# Patient Record
Sex: Female | Born: 1970 | Race: Black or African American | Hispanic: No | Marital: Married | Smoking: Never smoker
Health system: Southern US, Community
[De-identification: ages and names within clinical notes are randomized; demographics above are authoritative.]

## PROBLEM LIST (undated history)

## (undated) DIAGNOSIS — R87629 Unspecified abnormal cytological findings in specimens from vagina: Secondary | ICD-10-CM

## (undated) DIAGNOSIS — D509 Iron deficiency anemia, unspecified: Secondary | ICD-10-CM

## (undated) DIAGNOSIS — D219 Benign neoplasm of connective and other soft tissue, unspecified: Secondary | ICD-10-CM

## (undated) HISTORY — PX: LIPOSUCTION: SHX10

## (undated) HISTORY — DX: Benign neoplasm of connective and other soft tissue, unspecified: D21.9

## (undated) HISTORY — DX: Unspecified abnormal cytological findings in specimens from vagina: R87.629

## (undated) HISTORY — PX: CRYOTHERAPY: SHX1416

---

## 2018-02-25 ENCOUNTER — Encounter: Payer: Self-pay | Admitting: Obstetrics & Gynecology

## 2018-02-25 ENCOUNTER — Ambulatory Visit (INDEPENDENT_AMBULATORY_CARE_PROVIDER_SITE_OTHER): Payer: BLUE CROSS/BLUE SHIELD | Admitting: Obstetrics & Gynecology

## 2018-02-25 ENCOUNTER — Other Ambulatory Visit (HOSPITAL_COMMUNITY)
Admission: RE | Admit: 2018-02-25 | Discharge: 2018-02-25 | Disposition: A | Discharge status: Home / Self Care | Payer: BLUE CROSS/BLUE SHIELD | Source: Ambulatory Visit | Attending: Obstetrics & Gynecology | Admitting: Obstetrics & Gynecology

## 2018-02-25 VITALS — BP 124/84 | HR 76 | Ht 62.0 in | Wt 125.1 lb

## 2018-02-25 DIAGNOSIS — N939 Abnormal uterine and vaginal bleeding, unspecified: Secondary | ICD-10-CM | POA: Diagnosis present

## 2018-02-25 DIAGNOSIS — Z1239 Encounter for other screening for malignant neoplasm of breast: Secondary | ICD-10-CM

## 2018-02-25 DIAGNOSIS — Z3202 Encounter for pregnancy test, result negative: Secondary | ICD-10-CM

## 2018-02-25 LAB — POCT URINE PREGNANCY: Preg Test, Ur: NEGATIVE

## 2018-02-25 MED ORDER — MEGESTROL ACETATE 40 MG PO TABS: 40.0000 mg | ORAL_TABLET | Freq: Two times a day (BID) | ORAL | 3 refills | Status: DC

## 2018-02-25 NOTE — Patient Instructions (Addendum)
Endometrial Biopsy, Care After This sheet gives you information about how to care for yourself after your procedure. Your health care provider may also give you more specific instructions. If you have problems or questions, contact your health care provider. What can I expect after the procedure? After the procedure, it is common to have:  Mild cramping.  A small amount of vaginal bleeding for a few days. This is normal.  Follow these instructions at home:  Take over-the-counter and prescription medicines only as told by your health care provider.  Do not douche, use tampons, or have sexual intercourse until your health care provider approves.  Return to your normal activities as told by your health care provider. Ask your health care provider what activities are safe for you.  Follow instructions from your health care provider about any activity restrictions, such as restrictions on strenuous exercise or heavy lifting. Contact a health care provider if:  You have heavy bleeding, or bleed for longer than 2 days after the procedure.  You have bad smelling discharge from your vagina.  You have a fever or chills.  You have a burning sensation when urinating or you have difficulty urinating.  You have severe pain in your lower abdomen. Get help right away if:  You have severe cramps in your stomach or back.  You pass large blood clots.  Your bleeding increases.  You become weak or light-headed, or you pass out. Summary  After the procedure, it is common to have mild cramping and a small amount of vaginal bleeding for a few days.  Do not douche, use tampons, or have sexual intercourse until your health care provider approves.  Return to your normal activities as told by your health care provider. Ask your health care provider what activities are safe for you. This information is not intended to replace advice given to you by your health care provider. Make sure you discuss any  questions you have with your health care provider. Document Released: 03/03/2013 Document Revised: 05/29/2016 Document Reviewed: 05/29/2016 Elsevier Interactive Patient Education  2017 [employer] Dysfunctional Uterine Bleeding Dysfunctional uterine bleeding is abnormal bleeding from the uterus. Dysfunctional uterine bleeding includes:  A period that comes earlier or later than usual.  A period that is lighter, heavier, or has blood clots.  Bleeding between periods.  Skipping one or more periods.  Bleeding after sexual intercourse.  Bleeding after menopause.  Follow these instructions at home: Pay attention to any changes in your symptoms. Follow these instructions to help with your condition: Eating and drinking  Eat well-balanced meals. Include foods that are high in iron, such as liver, meat, shellfish, green leafy vegetables, and eggs.  If you become constipated: ? Drink plenty of water. ? Eat fruits and vegetables that are high in water and fiber, such as spinach, carrots, raspberries, apples, and mango. Medicines  Take over-the-counter and prescription medicines only as told by your health care provider.  Do not change medicines without talking with your health care provider.  Aspirin or medicines that contain aspirin may make the bleeding worse. Do not take those medicines: ? During the week before your period. ? During your period.  If you were prescribed iron pills, take them as told by your health care provider. Iron pills help to replace iron that your body loses because of this condition. Activity  If you need to change your sanitary pad or tampon more than one time every 2 hours: ? Lie in bed with your feet  raised (elevated). ? Place a cold pack on your lower abdomen. ? Rest as much as possible until the bleeding stops or slows down.  Do not try to lose weight until the bleeding has stopped and your blood iron level is back to normal. Other  Instructions  For two months, write down: ? When your period starts. ? When your period ends. ? When any abnormal bleeding occurs. ? What problems you notice.  Keep all follow up visits as told by your health care provider. This is important. Contact a health care provider if:  You get light-headed or weak.  You have nausea and vomiting.  You cannot eat or drink without vomiting.  You feel dizzy or have diarrhea while you are taking medicines.  You are taking birth control pills or hormones, and you want to change them or stop taking them. Get help right away if:  You develop a fever or chills.  You need to change your sanitary pad or tampon more than one time per hour.  Your bleeding becomes heavier, or your flow contains clots more often.  You develop pain in your abdomen.  You lose consciousness.  You develop a rash. This information is not intended to replace advice given to you by your health care provider. Make sure you discuss any questions you have with your health care provider. Document Released: 05/10/2000 Document Revised: 10/19/2015 Document Reviewed: 08/08/2014 Elsevier Interactive Patient Education  [employer].

## 2018-02-25 NOTE — Progress Notes (Signed)
Last pap on 09/04/2017 was normal.

## 2018-02-25 NOTE — Progress Notes (Signed)
Subjective:     Michele Harmon is a 47 y.o. female here for a routine exam. [id number] LNMP 05/2017 Current complaints: increased bleeding and cramps. cycles were prev 28 days and in 08/2017 the menses because heavy with clots. Pt is now wearing a tampon and an overnight pads.  The tampon has become uncomfortable. It would come out.   She was starting to wear the ultra super tampons but, even those were too uncomfortable due to how it sat in the pelvis. Pt now having menses irreg.  Pt b;eedfgin today very light Last spotting was Sept. The first day is light and by the 3rd day its heavy.  Pt will have 10-14 days between cycles. Pt not sexually active for 2 years.   Pt does recall begin dx'd with fibroids years prev but, she was told they were very small. Pt has started to feel a mss in her lower abd and does reports occ cramping in her plower abd.     Gynecologic History Patient's last menstrual period was 02/25/2018. Contraception: abstinence Last Pap: 08/2017. Results were: normal. Prev h/o abnormal pap: cells frozen many years prev >10  Last mammogram: 2017 Results were: normal  Obstetric History OB History  Gravida Para Term Preterm AB Living  2 2   2   2   SAB TAB Ectopic Multiple Live Births          2    # Outcome Date GA Lbr Len/2nd Weight Sex Delivery Anes PTL Lv  2 Preterm 2005 [redacted]w[redacted]d   M Vag-Spont EPI  LIV  1 Preterm 2001 [redacted]w[redacted]d   M Vag-Spont EPI  LIV     The following portions of the patient's history were reviewed and updated as appropriate: allergies, current medications, past family history, past medical history, past social history, past surgical history and problem list.  Review of Systems Pertinent items are noted in HPI.    Objective:  BP 124/84   Pulse 76   Ht 5\' 2"  (1.575 m)   Wt 125 lb 1.9 oz (56.8 kg)   LMP 02/25/2018   BMI 22.88 kg/m    CONSTITUTIONAL: Well-developed, well-nourished female in no acute distress.  HENT:  Normocephalic, atraumatic EYES: Conjunctivae  and EOM are normal. No scleral icterus.  NECK: Normal range of motion SKIN: Skin is warm and dry. No rash noted. Not diaphoretic.No pallor. [place]: Alert and oriented to person, place, and time. Normal coordination.  Lungs: CTA CV: RRR Abd; soft, NT, ND. Pelvic mass just above the symphysis pubis GU: EGBUS: no lesions Vagina: no blood in vault Cervix: no lesion; no mucopurulent d/c Uterus: enlarged mobile 12-14 weeks side. Extends to the right side. The mass is felt low on the left side. There is no tenderness. The uterus is mobile.  Adnexa: no masses; non tender   GYN procedure.  The indications for endometrial biopsy were reviewed.   Risks of the biopsy including cramping, bleeding, infection, uterine perforation, inadequate specimen and need for additional procedures  were discussed. The patient states she understands and agrees to undergo procedure today. Consent was signed. Time out was performed. Urine HCG was negative. A sterile speculum was placed in the patient's vagina and the cervix was prepped with Betadine. A single-toothed tenaculum was placed on the anterior lip of the cervix to stabilize it. The 3 mm pipelle was introduced into the endometrial cavity without difficulty to a depth of 10cm, and a moderate amount of tissue was obtained and sent to pathology. The instruments were  removed from the patient's vagina. Minimal bleeding from the cervix was noted. The patient tolerated the procedure well. Routine post-procedure instructions were given to the patient. The patient will follow up to review the results and for further management.     Assessment/ Plan:    1. Breast cancer screening  - MM DIGITAL SCREENING BILATERAL; Future  2. Abnormal uterine bleeding (AUB)- suspect due to uterine fibroids.   - US PELVIS (TRANSABDOMINAL ONLY); Future - US PELVIS TRANSVANGINAL NON-OB (TV ONLY); Future - POCT urine pregnancy - CBC - TSH Megace 40mg  bid.  3. Enlarged uterus-  fibroids.   Geniece Akers L. Harraway-Smith, M.D., [facility]lynn June

## 2018-02-26 LAB — CBC
HEMOGLOBIN: 12 g/dL (ref 11.1–15.9)
Hematocrit: 38.4 % (ref 34.0–46.6)
MCH: 24.6 pg — ABNORMAL LOW (ref 26.6–33.0)
MCHC: 31.3 g/dL — AB (ref 31.5–35.7)
MCV: 79 fL (ref 79–97)
PLATELETS: 313 10*3/uL (ref 150–450)
RBC: 4.88 x10E6/uL (ref 3.77–5.28)
RDW: 17.8 % — AB (ref 12.3–15.4)
WBC: 5.1 10*3/uL (ref 3.4–10.8)

## 2018-02-26 LAB — TSH: TSH: 2.1 u[IU]/mL (ref 0.450–4.500)

## 2018-03-02 ENCOUNTER — Telehealth: Payer: Self-pay

## 2018-03-02 ENCOUNTER — Ambulatory Visit (HOSPITAL_BASED_OUTPATIENT_CLINIC_OR_DEPARTMENT_OTHER): Payer: BLUE CROSS/BLUE SHIELD

## 2018-03-02 ENCOUNTER — Encounter (HOSPITAL_BASED_OUTPATIENT_CLINIC_OR_DEPARTMENT_OTHER): Payer: Self-pay

## 2018-03-02 ENCOUNTER — Ambulatory Visit (HOSPITAL_BASED_OUTPATIENT_CLINIC_OR_DEPARTMENT_OTHER)
Admission: RE | Admit: 2018-03-02 | Discharge: 2018-03-02 | Disposition: A | Discharge status: Home / Self Care | Payer: BLUE CROSS/BLUE SHIELD | Source: Ambulatory Visit | Attending: Obstetrics & Gynecology | Admitting: Obstetrics & Gynecology

## 2018-03-02 DIAGNOSIS — Z1239 Encounter for other screening for malignant neoplasm of breast: Secondary | ICD-10-CM | POA: Insufficient documentation

## 2018-03-02 NOTE — Telephone Encounter (Signed)
-----   Message from Lavonia Drafts, MD sent at 02/28/2018 11:27 PM EDT ----- Please call pt. Her endometrial biopsy shows possible polyps but, no evidence of malignancy. Keep taking the Megace and follow up as scheduled.   Thx  clh-S

## 2018-03-02 NOTE — Telephone Encounter (Signed)
Patient called and made aware that her endometrial biopsy was negative for any malignancy. Patient made aware that it did show some polyps and Dr.Harraway Tamala Julian would like for her to continue the Megace and follow up with her as scheduled. Kathrene Alu RN

## 2018-06-05 ENCOUNTER — Telehealth: Payer: Self-pay

## 2018-06-05 DIAGNOSIS — N939 Abnormal uterine and vaginal bleeding, unspecified: Secondary | ICD-10-CM

## 2018-06-05 MED ORDER — MEGESTROL ACETATE 40 MG PO TABS: 40.0000 mg | ORAL_TABLET | Freq: Two times a day (BID) | ORAL | 1 refills | Status: DC

## 2018-06-05 NOTE — Telephone Encounter (Signed)
Patient calling for refill on Megace. Patient needs to have follow up appointment. Patient placed on schedule and given refill to get her until follow up appointment. Kathrene Alu RN

## 2018-07-17 ENCOUNTER — Ambulatory Visit: Payer: BLUE CROSS/BLUE SHIELD | Admitting: Obstetrics & Gynecology

## 2018-07-22 ENCOUNTER — Encounter: Payer: Self-pay | Admitting: Obstetrics & Gynecology

## 2018-07-22 ENCOUNTER — Ambulatory Visit (HOSPITAL_BASED_OUTPATIENT_CLINIC_OR_DEPARTMENT_OTHER)
Admission: RE | Admit: 2018-07-22 | Discharge: 2018-07-22 | Disposition: A | Discharge status: Home / Self Care | Payer: BLUE CROSS/BLUE SHIELD | Source: Ambulatory Visit | Attending: Obstetrics & Gynecology | Admitting: Obstetrics & Gynecology

## 2018-07-22 ENCOUNTER — Ambulatory Visit (INDEPENDENT_AMBULATORY_CARE_PROVIDER_SITE_OTHER): Payer: BLUE CROSS/BLUE SHIELD | Admitting: Obstetrics & Gynecology

## 2018-07-22 VITALS — BP 137/89 | HR 82 | Resp 16 | Ht 62.0 in | Wt 124.1 lb

## 2018-07-22 DIAGNOSIS — D219 Benign neoplasm of connective and other soft tissue, unspecified: Secondary | ICD-10-CM | POA: Diagnosis not present

## 2018-07-22 DIAGNOSIS — N939 Abnormal uterine and vaginal bleeding, unspecified: Secondary | ICD-10-CM | POA: Diagnosis not present

## 2018-07-22 MED ORDER — MEGESTROL ACETATE 40 MG PO TABS: 40.0000 mg | ORAL_TABLET | Freq: Two times a day (BID) | ORAL | 6 refills | Status: DC

## 2018-07-22 MED ORDER — MEGESTROL ACETATE 40 MG PO TABS: 40.0000 mg | ORAL_TABLET | Freq: Two times a day (BID) | ORAL | 1 refills | Status: DC

## 2018-07-22 NOTE — Progress Notes (Signed)
History:  48 y.o. G2P0202 here today for f/u of AUB. S/p SVD x2 Pt reports that the bleeding is improved HOWEVER she wears a pad daily. It is much less but, its still spotting. Pt denies clots etc. Pt denies dizziness and SOB.  Pt reports that the bulge in her abd is very uncomfortable and is causing her pressure sx and urinary freq.     The following portions of the patient's history were reviewed and updated as appropriate: allergies, current medications, past family history, past medical history, past social history, past surgical history and problem list.  Review of Systems:  Pertinent items are noted in HPI.    Objective:  Physical Exam BP 137/89 (BP Location: Left Arm, Patient Position: Sitting, Cuff Size: Normal)   Pulse 82   Resp 16   Ht 5\' 2"  (1.575 m)   Wt 124 lb 1.3 oz (56.3 kg)   LMP 07/15/2018 (Approximate)   BMI 22.69 kg/m   CONSTITUTIONAL: Well-developed, well-nourished female in no acute distress.  HENT:  Normocephalic, atraumatic EYES: Conjunctivae and EOM are normal. No scleral icterus.  NECK: Normal range of motion SKIN: Skin is warm and dry. No rash noted. Not diaphoretic.No pallor. [place]: Alert and oriented to person, place, and time. Normal coordination.  Pelvic: deferred  Labs and Imaging 02/25/2018 Diagnosis Endometrium, biopsy - SECRETORY ENDOMETRIUM AND BENIGN ENDOMETRIAL POLYP. - NO HYPERPLASIA OR MALIGNANCY.  Assessment & Plan:  AUB  TV US today  Keep Megace 40mg  bid  Pt wants to f/u in 3 months to discuss hysterectomy. Written info given to  pt.  Will f/u sooner prn  Total face-to-face time with patient was 20 min.  Greater than 50% was spent in counseling and coordination of care with the patient.   Andalyn Heckstall L. Harraway-Smith, M.D., [facility] June

## 2018-07-22 NOTE — Patient Instructions (Signed)
Hysterectomy Information  A hysterectomy is a surgery in which the uterus is removed. The fallopian tubes and ovaries may be removed (bilateral salpingo-oophorectomy) as well. This procedure may be done to treat various medical problems. After the procedure, a woman will no longer have menstrual periods nor will she be able to become pregnant (sterile). What are the reasons for a hysterectomy? There are many reasons why a woman might have this procedure. They include:  Persistent, abnormal vaginal bleeding.  Long-term (chronic) pelvic pain or infection.  Endometriosis. This is when the lining of the uterus (endometrium) starts to grow outside the uterus.  Adenomyosis. This is when the endometrium starts to grow in the muscle of the uterus.  Pelvic organ prolapse. This is a condition in which the uterus falls down into the vagina.  Noncancerous growths in the uterus (uterine fibroids) that cause symptoms.  The presence of precancerous cells.  Cervical or uterine cancer. What are the different types of hysterectomy? There are three different types of hysterectomy:  Supracervical hysterectomy. In this type, the top part of the uterus is removed, but not the cervix.  Total hysterectomy. In this type, the uterus and cervix are removed.  Radical hysterectomy. In this type, the uterus, the cervix, and the tissue that holds the uterus in place (parametrium) are removed. What are the different ways a hysterectomy can be performed? There are many different ways a hysterectomy can be performed, including:  Abdominal hysterectomy. In this type, an incision is made in the abdomen. The uterus is removed through this incision.  Vaginal hysterectomy. In this type, an incision is made in the vagina. The uterus is removed through this incision. There are no abdominal incisions.  Conventional laparoscopic hysterectomy. In this type, three or four small incisions are made in the abdomen. A thin,  lighted tube with a camera (laparoscope) is inserted into one of the incisions. Other tools are put through the other incisions. The uterus is cut into small pieces. The small pieces are removed through the incisions or through the vagina.  Laparoscopically assisted vaginal hysterectomy (LAVH). In this type, three or four small incisions are made in the abdomen. Part of the surgery is performed laparoscopically and the other part is done vaginally. The uterus is removed through the vagina.  Robot-assisted laparoscopic hysterectomy. In this type, a laparoscope and other tools are inserted into three or four small incisions in the abdomen. A computer-controlled device is used to give the surgeon a 3D image and to help control the surgical instruments. This allows for more precise movements of surgical instruments. The uterus is cut into small pieces and removed through the incisions or removed through the vagina. Discuss the options with your health care provider to determine which type is the right one for you. What are the risks? Generally, this is a safe procedure. However, problems may occur, including:  Bleeding and risk of blood transfusion. Tell your health care provider if you do not want to receive any blood products.  Blood clots in the legs or lung.  Infection.  Damage to other structures or organs.  Allergic reactions to medicines.  Changing to an abdominal hysterectomy from one of the other techniques. What to expect after a hysterectomy  You will be given pain medicine.  You may need to stay in the hospital for 1- 2 days to recover, depending on the type of hysterectomy you had.  Follow your health care provider's instructions about exercise, driving, and general activities. Ask your   health care provider what activities are safe for you.  You will need to have someone with you for the first 3-5 days after you go home.  You will need to follow up with your surgeon in 2-4  weeks after surgery to evaluate your progress.  If the ovaries are removed, you will have early menopause symptoms such as hot flashes, night sweats, and insomnia.  If you had a hysterectomy for a problem that was not cancer or not a condition that could lead to cancer, then you no longer need Pap tests. However, even if you no longer need a Pap test, a regular pelvic exam is a good idea to make sure no other problems are developing. Questions to ask your health care provider  Is a hysterectomy medically necessary? Do I have other treatment options for my condition?  What are my options for hysterectomy procedure?  What organs and tissues need to be removed?  What are the risks?  What are the benefits?  How long will I need to stay in the hospital after the procedure?  How long will I need to recover at home?  What symptoms can I expect after the procedure? Summary  A hysterectomy is a surgery in which the uterus is removed. The fallopian tubes and ovaries may be removed (bilateral salpingo-oophorectomy) as well.  This procedure may be done to treat various medical problems. After the procedure, a woman will no longer have menstrual periods nor will she be able to become pregnant.  Discuss the options with your health care provider to determine which type of hysterectomy is the right one for you. This information is not intended to replace advice given to you by your health care provider. Make sure you discuss any questions you have with your health care provider. Document Released: 11/06/2000 Document Revised: 06/19/2016 Document Reviewed: 06/19/2016 Elsevier Interactive Patient Education  2019 [employer].

## 2018-10-08 ENCOUNTER — Ambulatory Visit: Payer: BLUE CROSS/BLUE SHIELD | Admitting: Obstetrics & Gynecology

## 2018-10-09 ENCOUNTER — Ambulatory Visit: Payer: BLUE CROSS/BLUE SHIELD | Admitting: Obstetrics & Gynecology

## 2018-11-23 ENCOUNTER — Other Ambulatory Visit: Payer: Self-pay

## 2018-11-23 ENCOUNTER — Encounter: Payer: Self-pay | Admitting: Obstetrics & Gynecology

## 2018-11-23 ENCOUNTER — Ambulatory Visit (INDEPENDENT_AMBULATORY_CARE_PROVIDER_SITE_OTHER): Payer: BC Managed Care – PPO | Admitting: Obstetrics & Gynecology

## 2018-11-23 DIAGNOSIS — N939 Abnormal uterine and vaginal bleeding, unspecified: Secondary | ICD-10-CM | POA: Diagnosis not present

## 2018-11-23 DIAGNOSIS — D219 Benign neoplasm of connective and other soft tissue, unspecified: Secondary | ICD-10-CM | POA: Diagnosis not present

## 2018-11-23 NOTE — Progress Notes (Signed)
Patient has had some bleeding everyday since Oct 2019. Kathrene Alu RN

## 2018-11-23 NOTE — Patient Instructions (Signed)
Hysterectomy Information  A hysterectomy is a surgery in which the uterus is removed. The fallopian tubes and ovaries may be removed (bilateral salpingo-oophorectomy) as well. This procedure may be done to treat various medical problems. After the procedure, a woman will no longer have menstrual periods nor will she be able to become pregnant (sterile). What are the reasons for a hysterectomy? There are many reasons why a woman might have this procedure. They include:  Persistent, abnormal vaginal bleeding.  Long-term (chronic) pelvic pain or infection.  Endometriosis. This is when the lining of the uterus (endometrium) starts to grow outside the uterus.  Adenomyosis. This is when the endometrium starts to grow in the muscle of the uterus.  Pelvic organ prolapse. This is a condition in which the uterus falls down into the vagina.  Noncancerous growths in the uterus (uterine fibroids) that cause symptoms.  The presence of precancerous cells.  Cervical or uterine cancer. What are the different types of hysterectomy? There are three different types of hysterectomy:  Supracervical hysterectomy. In this type, the top part of the uterus is removed, but not the cervix.  Total hysterectomy. In this type, the uterus and cervix are removed.  Radical hysterectomy. In this type, the uterus, the cervix, and the tissue that holds the uterus in place (parametrium) are removed. What are the different ways a hysterectomy can be performed? There are many different ways a hysterectomy can be performed, including:  Abdominal hysterectomy. In this type, an incision is made in the abdomen. The uterus is removed through this incision.  Vaginal hysterectomy. In this type, an incision is made in the vagina. The uterus is removed through this incision. There are no abdominal incisions.  Conventional laparoscopic hysterectomy. In this type, three or four small incisions are made in the abdomen. A thin,  lighted tube with a camera (laparoscope) is inserted into one of the incisions. Other tools are put through the other incisions. The uterus is cut into small pieces. The small pieces are removed through the incisions or through the vagina.  Laparoscopically assisted vaginal hysterectomy (LAVH). In this type, three or four small incisions are made in the abdomen. Part of the surgery is performed laparoscopically and the other part is done vaginally. The uterus is removed through the vagina.  Robot-assisted laparoscopic hysterectomy. In this type, a laparoscope and other tools are inserted into three or four small incisions in the abdomen. A computer-controlled device is used to give the surgeon a 3D image and to help control the surgical instruments. This allows for more precise movements of surgical instruments. The uterus is cut into small pieces and removed through the incisions or removed through the vagina. Discuss the options with your health care provider to determine which type is the right one for you. What are the risks? Generally, this is a safe procedure. However, problems may occur, including:  Bleeding and risk of blood transfusion. Tell your health care provider if you do not want to receive any blood products.  Blood clots in the legs or lung.  Infection.  Damage to other structures or organs.  Allergic reactions to medicines.  Changing to an abdominal hysterectomy from one of the other techniques. What to expect after a hysterectomy  You will be given pain medicine.  You may need to stay in the hospital for 1- 2 days to recover, depending on the type of hysterectomy you had.  Follow your health care provider's instructions about exercise, driving, and general activities. Ask your   health care provider what activities are safe for you.  You will need to have someone with you for the first 3-5 days after you go home.  You will need to follow up with your surgeon in 2-4  weeks after surgery to evaluate your progress.  If the ovaries are removed, you will have early menopause symptoms such as hot flashes, night sweats, and insomnia.  If you had a hysterectomy for a problem that was not cancer or not a condition that could lead to cancer, then you no longer need Pap tests. However, even if you no longer need a Pap test, a regular pelvic exam is a good idea to make sure no other problems are developing. Questions to ask your health care provider  Is a hysterectomy medically necessary? Do I have other treatment options for my condition?  What are my options for hysterectomy procedure?  What organs and tissues need to be removed?  What are the risks?  What are the benefits?  How long will I need to stay in the hospital after the procedure?  How long will I need to recover at home?  What symptoms can I expect after the procedure? Summary  A hysterectomy is a surgery in which the uterus is removed. The fallopian tubes and ovaries may be removed (bilateral salpingo-oophorectomy) as well.  This procedure may be done to treat various medical problems. After the procedure, a woman will no longer have menstrual periods nor will she be able to become pregnant.  Discuss the options with your health care provider to determine which type of hysterectomy is the right one for you. This information is not intended to replace advice given to you by your health care provider. Make sure you discuss any questions you have with your health care provider. Document Released: 11/06/2000 Document Revised: 04/25/2017 Document Reviewed: 06/19/2016 Elsevier Patient Education  2020 [employer].   Post-operative Instructions after Hysterectomy  Activity  During the first few days at home, you will need to listen to your body.  It is important to be up and move around.  It is equally important to rest when you feel tired.  You should not, however, just lie in bed for days.   This will make you stiff and does increase your risk of clot formation right after your surgery.  Try to increase activity level slowly each day.  You may climb stairs. Go slowly at first.  No heavy lifting (>pounds) for at least four weeks.  Heavy household chores like vacuuming, mopping, pushing furniture, and activities that require repetitive reaching should be avoided.  You can cook if it doesn't make you uncomfortable.  You can do laundry, just be careful when taking items in and out of the washer/dryer.  Do not lift a heavy laundry basket.  You can go grocery shopping if it doesn't make you too tired.  In general, you can bend and you can lift light objects but you must listen to your body.  If it hurts, don't do it.  You can drive in 9-92 days after surgery.  This is not a rule but a guideline. If you feel you can can comfortably sit behind the wheel, turn your head, and hit the breakes hard, then you can drive.  If you are taking narcotic pain medication during the day, then you SHOULD NOT DRIVE.  Nothing in the vagina for six weeks, including tampons, douching, or engaging in sexual activity.  You can shower the day you  get home from the hospital. No tub baths for one week.  Pain Management  You will have a prescription for medication containing narcotics, either Vicodin 5/500 or Percocet 5/325. You can take 2 tablets every 4-6 hours as needed for pain.  Only take this when you are hurting, not just every four hours because the directions say you can.  You should find that each day the pain is improved and that you can take less and/or spread out the time between doses.  You will also be given a prescription for Motrin 800 mg or advised to use over the counter Motrin 200 mg.  Generic Ibuprofen is the same as Motrin.  You can take 800 mg (or 4-200 mg tablets) every 8 with hours. Alternating this with your narcotic pain medication will help you use less pain medication.  You should not  exceed taking 2000 mg of Tylenol. This equals either four Vicodin or six Percocet.  You may find that for a day or two you require more medication than stated but this should not continue for several days.  If it does, please call the office so your medication can be changed.  Most narcotic pain medications require a hand written prescription.  If we need to change your medication, enough time is needed for a family member or friend to get to the office to pick up the prescription.  You can use a heating pad. This is especially good for pelvic cramping.  Incision Care  Your incision will either have Amer small paper strips on the (Steri-strips) or have skin super glue (Dermabond).  Do not pick these off.  They will get loose over the first two weeks after surgery.  When they are about to fall off, you can then remove them.  Use soap and water only to clean the incisions. An antibacterial soap like Dial is good to use.   Once the strips or glue comes off , you can use Neosporin, if you like.  After about three weeks, if you want to use something to help the scars go away faster; you may use Mederma or Vitamin E.  Use a small amount twice daily on the scars and rub it in good.  If you have any redness or draining from the incision that makes you think there is infection, pleas call the office.  Urination and Bowel Movements  A catheter is used to drain your bladder during the surgery.   You may have some stinging the first few days after surgery when you urinate.  This is normal.  It should go away quickly.  Some will experience bladder spasms for several days after surgery. This feels like a pain at the end of emptying your bladder.  This is not worrisome.  If it is getting worse or more intense, there are some medications tat can be used short term to help.  You will need to call the office.   Most women will have a bowel movement 2-4 days after surgery.  It takes this long because you will  probably d a bowel prep before surgery, because your food intake is often decreased and because narcotics pain medications can cause constipation.  To help prevent constipation, drink lots of fluids and stay hydrated.  You should use a mild stool softener like over the counter Colace (Docusate Sodium) twice daily.  Also, drinking warm liquids like coffee, tea, and hot chocolate can help.  Moving around helps too.  Stop the stool softener when  you are having regular bowel movements.  If the constipation is severe, you can progress to using Miralax (an over the counter product that you mix with a small amount of water) or use an enema.  DO NOT USE ANY LAXATIVES.  If you have questions, call the office.  Vaginal Care  You do not need to do anything special.  Just shower normally .  Expect spotting or light bleeding. Lonia Blood is different for everyone and can be as Villavicencio as spotting for a couple of days after surgery or spotting/light bleeding for weeks.  Do not use tampons.  You will need to wear a mini pad.  Important Reasons to Call the Office  Fever > 100.5. This is a real post-operative temperature and you will need to be seen.  Heavy vaginal bleeding like a period.  Unusual vaginal discharge or odor.  Redness or drainage from your incisions that look like infection.   Total Laparoscopic Hysterectomy A total laparoscopic hysterectomy is a minimally invasive surgery to remove the uterus and cervix. The fallopian tubes and ovaries can also be removed (bilateral salpingo-oophorectomy) during this surgery, if necessary. This procedure may be done to treat problems such as:  Noncancerous growths in the uterus (uterine fibroids) that cause symptoms.  A condition that causes the lining of the uterus (endometrium) to grow in other areas (endometriosis).  Problems with pelvic support. This is caused by weakened muscles of the pelvis following vaginal childbirth or menopause.  Cancer of the  cervix, ovaries, uterus, or endometrium.  Excessive (dysfunctional) uterine bleeding. This surgery is performed by inserting a thin, lighted tube (laparoscope) and surgical instruments into small incisions in the abdomen. The laparoscope sends images to a monitor. The images help the health care provider perform the procedure. After this procedure, you will no longer be able to have a baby, and you will no longer have a menstrual period. Tell a health care provider about:  Any allergies you have.  All medicines you are taking, including vitamins, herbs, eye drops, creams, and over-the-counter medicines.  Any problems you or family members have had with anesthetic medicines.  Any blood disorders you have.  Any surgeries you have had.  Any medical conditions you have.  Whether you are pregnant or may be pregnant. What are the risks? Generally, this is a safe procedure. However, problems may occur, including:  Infection.  Bleeding.  Blood clots in the legs or lungs.  Allergic reactions to medicines.  Damage to other structures or organs.  The risk that the surgery may have to be switched to the regular one in which a large incision is made in the abdomen (abdominal hysterectomy). What happens before the procedure? Staying hydrated Follow instructions from your health care provider about hydration, which may include:  Up to 2 hours before the procedure - you may continue to drink clear liquids, such as water, clear fruit juice, black coffee, and plain tea Eating and drinking restrictions Follow instructions from your health care provider about eating and drinking, which may include:  8 hours before the procedure - stop eating heavy meals or foods such as meat, fried foods, or fatty foods.  6 hours before the procedure - stop eating light meals or foods, such as toast or cereal.  6 hours before the procedure - stop drinking milk or drinks that contain milk.  2 hours before  the procedure - stop drinking clear liquids. Medicines  Ask your health care provider about: ? Changing or stopping your  regular medicines. This is especially important if you are taking diabetes medicines or blood thinners. ? Taking over-the-counter medicines, vitamins, herbs, and supplements. ? Taking medicines such as aspirin and ibuprofen. These medicines can thin your blood. Do not take these medicines unless your health care provider tells you to take them.  You may be given antibiotic medicine to help prevent infection.  You may be asked to take laxatives.  You may be given medicines to help prevent nausea and vomiting after the procedure. General instructions  Ask your health care provider how your surgical site will be marked or identified.  You may be asked to shower with a germ-killing soap.  Do not use any products that contain nicotine or tobacco, such as cigarettes and e-cigarettes. If you need help quitting, ask your health care provider.  You may have an exam or testing, such as an ultrasound to determine the size and shape of your pelvic organs.  You may have a blood or urine sample taken.  This procedure can affect the way you feel about yourself. Talk with your health care provider about the physical and emotional changes hysterectomy may cause.  Plan to have someone take you home from the hospital or clinic.  Plan to have a responsible adult care for you for at least 24 hours after you leave the hospital or clinic. This is important. What happens during the procedure?  To lower your risk of infection: ? Your health care team will wash or sanitize their hands. ? Your skin will be washed with soap. ? Hair may be removed from the surgical area.  An IV will be inserted into one of your veins.  You will be given one or more of the following: ? A medicine to help you relax (sedative). ? A medicine to make you fall asleep (general anesthetic).  You will be  given antibiotic medicine through your IV.  A tube may be inserted down your throat to help you breathe during the procedure.  A gas (carbon dioxide) will be used to inflate your abdomen to allow your surgeon to see inside of your abdomen.  Three or four small incisions will be made in your abdomen.  A laparoscope will be inserted into one of your incisions. Surgical instruments will be inserted through the other incisions in order to perform the procedure.  Your uterus and cervix may be removed through your vagina or cut into small pieces and removed through the small incisions. Any other organs that need to be removed will also be removed this way.  Carbon dioxide will be released from inside of your abdomen.  Your incisions will be closed with stitches (sutures).  A bandage (dressing) may be placed over your incisions. The procedure may vary among health care providers and hospitals. What happens after the procedure?  Your blood pressure, heart rate, breathing rate, and blood oxygen level will be monitored until the medicines you were given have worn off.  You will be given medicine for pain and nausea as needed.  Do not drive for 24 hours if you received a sedative. Summary  Total Laparoscopic hysterectomy is a procedure to remove your uterus, cervix and sometimes the fallopian tubes and ovaries.  This procedure can affect the way you feel about yourself. Talk with your health care provider about the physical and emotional changes hysterectomy may cause.  After this procedure, you will no longer be able to have a baby, and you will no longer have a menstrual  period.  You will be given pain medicine to control discomfort after this procedure. This information is not intended to replace advice given to you by your health care provider. Make sure you discuss any questions you have with your health care provider. Document Released: 03/10/2007 Document Revised: 04/25/2017 Document  Reviewed: 07/24/2016 Elsevier Patient Education  2020 [employer].

## 2018-11-23 NOTE — Progress Notes (Signed)
History:  48 y.o. [id number] here today for f/u of AUB due to fibroids and discussion of management. Pt would like to proceed with a hysterectomy but, she wants to discuss the price before she decides on a location.  She is taking the Megace and reports improvement of the bleeding but, she does not want to continue the meds and she reports pain as well and wants definitive management. She works at a sedentary job.    The following portions of the patient's history were reviewed and updated as appropriate: allergies, current medications, past family history, past medical history, past social history, past surgical history and problem list.  Review of Systems:  Pertinent items are noted in HPI.    Objective:  Physical Exam Blood pressure 121/86, pulse 86, height 5\' 2"  (1.575 m), weight 125 lb (56.7 kg).  CONSTITUTIONAL: Well-developed, well-nourished female in no acute distress.  HENT:  Normocephalic, atraumatic EYES: Conjunctivae and EOM are normal. No scleral icterus.  NECK: Normal range of motion SKIN: Skin is warm and dry. No rash noted. Not diaphoretic.No pallor. [place]: Alert and oriented to person, place, and time. Normal coordination.  Abd: Soft, nontender and nondistended; mobile mass effect in lower abd  Pelvic: Normal appearing external genitalia; normal appearing vaginal mucosa and cervix.  Normal discharge.  Enalrged uterus, 12 weeks sized. Mobile. Wide base. No other palpable masses, no uterine or adnexal tenderness. Some descensus.     Assessment & Plan:  Symptomatic uterine fibroids. I reviewed hysterectomy options and answered questions in details about the different types including RATH, TLH, LAVH, TVH and TAH. I reveiwed the risks and benefits of each type based on her specific presentation and uterine size.   Patient desires surgical management with TLH with bilateral salpingectomy. . Pt may consider [place] with bilateral salpingectomy depending on cost. She would like to  speak with a financial counselor.   She will f/u prior to the surgery for preop counseling once she determines will which type of procedure to have.  She was told that she will be contacted by our surgical scheduler regarding the time and date of her surgery.Printed patient education handouts about the procedure were given to the patient to review at home.  Total face-to-face time with patient was 20 min.  Greater than 50% was spent in counseling and coordination of care with the patient.   Cambrie Sonnenfeld L. Harraway-Smith, M.D., [facility]lynn June

## 2018-12-10 ENCOUNTER — Ambulatory Visit (INDEPENDENT_AMBULATORY_CARE_PROVIDER_SITE_OTHER): Payer: BC Managed Care – PPO | Admitting: Obstetrics & Gynecology

## 2018-12-10 ENCOUNTER — Encounter: Payer: Self-pay | Admitting: Obstetrics & Gynecology

## 2018-12-10 VITALS — Ht 62.0 in

## 2018-12-10 DIAGNOSIS — N939 Abnormal uterine and vaginal bleeding, unspecified: Secondary | ICD-10-CM | POA: Diagnosis not present

## 2018-12-10 DIAGNOSIS — D219 Benign neoplasm of connective and other soft tissue, unspecified: Secondary | ICD-10-CM

## 2018-12-10 MED ORDER — INTEGRA F 125-1 MG PO CAPS: 1.0000 | ORAL_CAPSULE | Freq: Two times a day (BID) | ORAL | 6 refills | Status: DC

## 2018-12-10 NOTE — Progress Notes (Signed)
TELEHEALTH GYNECOLOGY VIRTUAL VIDEO VISIT ENCOUNTER NOTE  Provider location: [facility] [employer] at [facility]   I connected with [place] on 12/10/18 at  8:30 AM EDT by WebEx Video Encounter at home and verified that I am speaking with the correct person using two identifiers.   I discussed the limitations, risks, security and privacy concerns of performing an evaluation and management service virtually and the availability of in person appointments. I also discussed with the patient that there may be a patient responsible charge related to this service. The patient expressed understanding and agreed to proceed.   History:  Michele Harmon is a 48 y.o. [id number] LMP continued bleeding for 9 months. Being evaluated today for preop counseling prior to a [place] with bilateral salpingectomy.  She denies any new sx since her last visit.       Past Medical History:  Diagnosis Date   Fibroids    Vaginal Pap smear, abnormal    Past Surgical History:  Procedure Laterality Date   CRYOTHERAPY     cervical   LIPOSUCTION Bilateral    arms   The following portions of the patient's history were reviewed and updated as appropriate: allergies, current medications, past family history, past medical history, past social history, past surgical history and problem list.   Health Maintenance:  Normal pap and negative HRHPV on 08/2017.  Normal mammogram on 10//2019 .  Endo bx: 02/25/2018 Diagnosis Endometrium, biopsy - SECRETORY ENDOMETRIUM AND BENIGN ENDOMETRIAL POLYP. - NO HYPERPLASIA OR MALIGNANCY. Review of Systems:  Pertinent items noted in HPI and remainder of comprehensive ROS otherwise negative.  Physical Exam:   General:  Alert, oriented and cooperative. Patient appears to be in no acute distress.  Mental Status: Normal mood and affect. Normal behavior. Normal judgment and thought content.   Respiratory: Normal respiratory effort, no problems with respiration noted    Rest of physical exam deferred due to type of encounter  Labs and Imaging  02/25/2018  Diagnosis Endometrium, biopsy - SECRETORY ENDOMETRIUM AND BENIGN ENDOMETRIAL POLYP. - NO HYPERPLASIA OR MALIGNANCY.  CBC    Component Value Date/Time   WBC 5.1 02/25/2018 1530   RBC 4.88 02/25/2018 1530   HGB 12.0 02/25/2018 1530   HCT 38.4 02/25/2018 1530   PLT 313 02/25/2018 1530   MCV 79 02/25/2018 1530   MCH 24.6 (L) 02/25/2018 1530   MCHC 31.3 (L) 02/25/2018 1530   RDW 17.8 (H) 02/25/2018 1530    Assessment and Plan:     1. Fibroids 2. AUB  Patient desires surgical management with [facility] .  The risks of surgery were discussed in detail with the patient including but not limited to: bleeding which may require transfusion or reoperation; infection which may require prolonged hospitalization or re-hospitalization and antibiotic therapy; injury to bowel, bladder, ureters and major vessels or other surrounding organs; need for additional procedures including laparotomy; thromboembolic phenomenon, incisional problems and other postoperative or anesthesia complications.  Patient was told that the likelihood that her condition and symptoms will be treated effectively with this surgical management was very high; the postoperative expectations were also discussed in detail. The patient also understands the alternative treatment options which were discussed in full. All questions were answered.  She was told that she will be contacted by our surgical scheduler regarding the time and date of her surgery; routine preoperative instructions of having nothing to eat or drink after midnight on the day prior to surgery and also coming to the hospital  1 1/2 hours prior to her time of surgery were also emphasized.  She was told she may be called for a preoperative appointment about a week prior to surgery and will be given further preoperative instructions at that visit. Printed patient education handouts about the  procedure were given to the patient to review at home.       I discussed the assessment and treatment plan with the patient. The patient was provided an opportunity to ask questions and all were answered. The patient agreed with the plan and demonstrated an understanding of the instructions.   The patient was advised to call back or seek an in-person evaluation/go to the ED if the symptoms worsen or if the condition fails to improve as anticipated.  I provided 25 minutes of face-to-face time during this encounter.   Lavonia Drafts, MD [facility], [place]

## 2018-12-10 NOTE — Patient Instructions (Signed)
Total Laparoscopic Hysterectomy °A total laparoscopic hysterectomy is a minimally invasive surgery to remove the uterus and cervix. The fallopian tubes and ovaries can also be removed (bilateral salpingo-oophorectomy) during this surgery, if necessary. This procedure may be done to treat problems such as: °· Noncancerous growths in the uterus (uterine fibroids) that cause symptoms. °· A condition that causes the lining of the uterus (endometrium) to grow in other areas (endometriosis). °· Problems with pelvic support. This is caused by weakened muscles of the pelvis following vaginal childbirth or menopause. °· Cancer of the cervix, ovaries, uterus, or endometrium. °· Excessive (dysfunctional) uterine bleeding. °This surgery is performed by inserting a thin, lighted tube (laparoscope) and surgical instruments into small incisions in the abdomen. The laparoscope sends images to a monitor. The images help the health care provider perform the procedure. After this procedure, you will no longer be able to have a baby, and you will no longer have a menstrual period. °Tell a health care provider about: °· Any allergies you have. °· All medicines you are taking, including vitamins, herbs, eye drops, creams, and over-the-counter medicines. °· Any problems you or family members have had with anesthetic medicines. °· Any blood disorders you have. °· Any surgeries you have had. °· Any medical conditions you have. °· Whether you are pregnant or may be pregnant. °What are the risks? °Generally, this is a safe procedure. However, problems may occur, including: °· Infection. °· Bleeding. °· Blood clots in the legs or lungs. °· Allergic reactions to medicines. °· Damage to other structures or organs. °· The risk that the surgery may have to be switched to the regular one in which a large incision is made in the abdomen (abdominal hysterectomy). °What happens before the procedure? °Staying hydrated °Follow instructions from your  health care provider about hydration, which may include: °· Up to 2 hours before the procedure - you may continue to drink clear liquids, such as water, clear fruit juice, black coffee, and plain tea °Eating and drinking restrictions °Follow instructions from your health care provider about eating and drinking, which may include: °· 8 hours before the procedure - stop eating heavy meals or foods such as meat, fried foods, or fatty foods. °· 6 hours before the procedure - stop eating light meals or foods, such as toast or cereal. °· 6 hours before the procedure - stop drinking milk or drinks that contain milk. °· 2 hours before the procedure - stop drinking clear liquids. °Medicines °· Ask your health care provider about: °? Changing or stopping your regular medicines. This is especially important if you are taking diabetes medicines or blood thinners. °? Taking over-the-counter medicines, vitamins, herbs, and supplements. °? Taking medicines such as aspirin and ibuprofen. These medicines can thin your blood. Do not take these medicines unless your health care provider tells you to take them. °· You may be given antibiotic medicine to help prevent infection. °· You may be asked to take laxatives. °· You may be given medicines to help prevent nausea and vomiting after the procedure. °General instructions °· Ask your health care provider how your surgical site will be marked or identified. °· You may be asked to shower with a germ-killing soap. °· Do not use any products that contain nicotine or tobacco, such as cigarettes and e-cigarettes. If you need help quitting, ask your health care provider. °· You may have an exam or testing, such as an ultrasound to determine the size and shape of your pelvic organs. °·   You may have a blood or urine sample taken.  This procedure can affect the way you feel about yourself. Talk with your health care provider about the physical and emotional changes hysterectomy may  cause.  Plan to have someone take you home from the hospital or clinic.  Plan to have a responsible adult care for you for at least 24 hours after you leave the hospital or clinic. This is important. What happens during the procedure?  To lower your risk of infection: ? Your health care team will wash or sanitize their hands. ? Your skin will be washed with soap. ? Hair may be removed from the surgical area.  An IV will be inserted into one of your veins.  You will be given one or more of the following: ? A medicine to help you relax (sedative). ? A medicine to make you fall asleep (general anesthetic).  You will be given antibiotic medicine through your IV.  A tube may be inserted down your throat to help you breathe during the procedure.  A gas (carbon dioxide) will be used to inflate your abdomen to allow your surgeon to see inside of your abdomen.  Three or four small incisions will be made in your abdomen.  A laparoscope will be inserted into one of your incisions. Surgical instruments will be inserted through the other incisions in order to perform the procedure.  Your uterus and cervix may be removed through your vagina or cut into small pieces and removed through the small incisions. Any other organs that need to be removed will also be removed this way.  Carbon dioxide will be released from inside of your abdomen.  Your incisions will be closed with stitches (sutures).  A bandage (dressing) may be placed over your incisions. The procedure may vary among health care providers and hospitals. What happens after the procedure?  Your blood pressure, heart rate, breathing rate, and blood oxygen level will be monitored until the medicines you were given have worn off.  You will be given medicine for pain and nausea as needed.  Do not drive for 24 hours if you received a sedative. Summary  Total Laparoscopic hysterectomy is a procedure to remove your uterus, cervix and  sometimes the fallopian tubes and ovaries.  This procedure can affect the way you feel about yourself. Talk with your health care provider about the physical and emotional changes hysterectomy may cause.  After this procedure, you will no longer be able to have a baby, and you will no longer have a menstrual period.  You will be given pain medicine to control discomfort after this procedure. This information is not intended to replace advice given to you by your health care provider. Make sure you discuss any questions you have with your health care provider. Document Released: 03/10/2007 Document Revised: 04/25/2017 Document Reviewed: 07/24/2016 Elsevier Patient Education  2020 [place] [employer]. Total Laparoscopic Hysterectomy, Care After This sheet gives you information about how to care for yourself after your procedure. Your health care provider may also give you more specific instructions. If you have problems or questions, contact your health care provider. What can I expect after the procedure? After the procedure, it is common to have:  Pain and bruising around your incisions.  A sore throat, if a breathing tube was used during surgery.  Fatigue.  Poor appetite.  Less interest in sex. If your ovaries were also removed, it is also common to have symptoms of menopause such as hot  flashes, night sweats, and lack of sleep (insomnia). Follow these instructions at home: Bathing  Do not take baths, swim, or use a hot tub until your health care provider approves. You may need to only take showers for 2-3 weeks.  Keep your bandage (dressing) dry until your health care provider says it can be removed. Incision care   Follow instructions from your health care provider about how to take care of your incisions. Make sure you: ? Wash your hands with soap and water before you change your dressing. If soap and water are not available, use hand sanitizer. ? Change your dressing as told by  your health care provider. ? Leave stitches (sutures), skin glue, or adhesive strips in place. These skin closures may need to stay in place for 2 weeks or longer. If adhesive strip edges start to loosen and curl up, you may trim the loose edges. Do not remove adhesive strips completely unless your health care provider tells you to do that.  Check your incision area every day for signs of infection. Check for: ? Redness, swelling, or pain. ? Fluid or blood. ? Warmth. ? Pus or a bad smell. Activity  Get plenty of rest and sleep.  Do not lift anything that is heavier than 10 lbs (4.5 kg) for one month after surgery, or as long as told by your health care provider.  Do not drive or use heavy machinery while taking prescription pain medicine.  Do not drive for 24 hours if you were given a medicine to help you relax (sedative).  Return to your normal activities as told by your health care provider. Ask your health care provider what activities are safe for you. Lifestyle   Do not use any products that contain nicotine or tobacco, such as cigarettes and e-cigarettes. These can delay healing. If you need help quitting, ask your health care provider.  Do not drink alcohol until your health care provider approves. General instructions  Do not douche, use tampons, or have sex for at least 6 weeks, or as told by your health care provider.  Take over-the-counter and prescription medicines only as told by your health care provider.  To monitor yourself for a fever, take your temperature at least once a day during recovery.  If you struggle with physical or emotional changes after your procedure, speak with your health care provider or a therapist.  To prevent or treat constipation while you are taking prescription pain medicine, your health care provider may recommend that you: ? Drink enough fluid to keep your urine clear or pale yellow. ? Take over-the-counter or prescription  medicines. ? Eat foods that are high in fiber, such as fresh fruits and vegetables, whole grains, and beans. ? Limit foods that are high in fat and processed sugars, such as fried and sweet foods.  Keep all follow-up visits as told by your health care provider. This is important. Contact a health care provider if:  You have chills or a fever.  You have redness, swelling, or pain around an incision.  You have fluid or blood coming from an incision.  Your incision feels warm to the touch.  You have pus or a bad smell coming from an incision.  An incision breaks open.  You feel dizzy or light-headed.  You have pain or bleeding when you urinate.  You have diarrhea, nausea, or vomiting that does not go away.  You have abnormal vaginal discharge.  You have a rash.  You have  pain that does not get better with medicine. Get help right away if:  You have a fever and your symptoms suddenly get worse.  You have severe abdominal pain.  You have chest pain.  You have shortness of breath.  You faint.  You have pain, swelling, or redness on your leg.  You have heavy vaginal bleeding with blood clots. Summary  After the procedure it is common to have abdominal pain. Your provider will give you medication for this.  Do not take baths, swim, or use a hot tub until your health care provider approves.  Do not lift anything that is heavier than 10 lbs (4.5 kg) for one month after surgery, or as long as told by your health care provider.  Notify your provider if you have any signs or symptoms of infection after the procedure. This information is not intended to replace advice given to you by your health care provider. Make sure you discuss any questions you have with your health care provider. Document Released: 03/03/2013 Document Revised: 04/25/2017 Document Reviewed: 07/24/2016 Elsevier Patient Education  2020 [employer].

## 2018-12-13 ENCOUNTER — Encounter: Payer: Self-pay | Admitting: Obstetrics & Gynecology

## 2018-12-24 NOTE — Progress Notes (Signed)
Please clarify on surgical consent order/epic noted abrreviations. Thanks.

## 2018-12-25 ENCOUNTER — Other Ambulatory Visit (HOSPITAL_COMMUNITY)
Admission: RE | Admit: 2018-12-25 | Discharge: 2018-12-25 | Disposition: A | Discharge status: Home / Self Care | Payer: BC Managed Care – PPO | Source: Ambulatory Visit | Attending: Obstetrics & Gynecology | Admitting: Obstetrics & Gynecology

## 2018-12-25 DIAGNOSIS — Z01812 Encounter for preprocedural laboratory examination: Secondary | ICD-10-CM | POA: Diagnosis present

## 2018-12-25 DIAGNOSIS — Z20828 Contact with and (suspected) exposure to other viral communicable diseases: Secondary | ICD-10-CM | POA: Insufficient documentation

## 2018-12-25 LAB — SARS CORONAVIRUS 2 (TAT 6-24 HRS): SARS Coronavirus 2: NEGATIVE

## 2018-12-28 ENCOUNTER — Other Ambulatory Visit: Payer: Self-pay

## 2018-12-28 ENCOUNTER — Encounter (HOSPITAL_COMMUNITY)
Admission: RE | Admit: 2018-12-28 | Discharge: 2018-12-28 | Disposition: A | Discharge status: Home / Self Care | Payer: BC Managed Care – PPO | Source: Ambulatory Visit | Attending: Obstetrics & Gynecology | Admitting: Obstetrics & Gynecology

## 2018-12-28 ENCOUNTER — Encounter (HOSPITAL_COMMUNITY): Payer: Self-pay

## 2018-12-28 DIAGNOSIS — Z01812 Encounter for preprocedural laboratory examination: Secondary | ICD-10-CM | POA: Diagnosis present

## 2018-12-28 DIAGNOSIS — N939 Abnormal uterine and vaginal bleeding, unspecified: Secondary | ICD-10-CM | POA: Diagnosis not present

## 2018-12-28 DIAGNOSIS — D259 Leiomyoma of uterus, unspecified: Secondary | ICD-10-CM | POA: Diagnosis not present

## 2018-12-28 HISTORY — DX: Iron deficiency anemia, unspecified: D50.9

## 2018-12-28 LAB — CBC
HCT: 33.9 % — ABNORMAL LOW (ref 36.0–46.0)
Hemoglobin: 9.8 g/dL — ABNORMAL LOW (ref 12.0–15.0)
MCH: 20.4 pg — ABNORMAL LOW (ref 26.0–34.0)
MCHC: 28.9 g/dL — ABNORMAL LOW (ref 30.0–36.0)
MCV: 70.6 fL — ABNORMAL LOW (ref 80.0–100.0)
Platelets: 417 10*3/uL — ABNORMAL HIGH (ref 150–400)
RBC: 4.8 MIL/uL (ref 3.87–5.11)
RDW: 20.9 % — ABNORMAL HIGH (ref 11.5–15.5)
WBC: 7 10*3/uL (ref 4.0–10.5)
nRBC: 0 % (ref 0.0–0.2)

## 2018-12-28 LAB — BASIC METABOLIC PANEL
Anion gap: 6 (ref 5–15)
BUN: 9 mg/dL (ref 6–20)
CO2: 24 mmol/L (ref 22–32)
Calcium: 9.8 mg/dL (ref 8.9–10.3)
Chloride: 112 mmol/L — ABNORMAL HIGH (ref 98–111)
Creatinine, Ser: 0.73 mg/dL (ref 0.44–1.00)
GFR calc Af Amer: >60 mL/min (ref >60)
GFR calc non Af Amer: >60 mL/min (ref >60)
Glucose, Bld: 87 mg/dL (ref 70–99)
Potassium: 3.8 mmol/L (ref 3.5–5.1)
Sodium: 142 mmol/L (ref 135–145)

## 2018-12-28 LAB — ABO/RH: ABO/RH(D): B POS

## 2018-12-28 NOTE — Anesthesia Preprocedure Evaluation (Addendum)
Anesthesia Evaluation  Patient identified by MRN, date of birth, ID band Patient awake    Reviewed: Allergy & Precautions, NPO status , Patient's Chart, lab work & pertinent test results  Airway Mallampati: II  TM Distance: >3 FB Neck ROM: Full    Dental  (+) Dental Advisory Given   Pulmonary    breath sounds clear to auscultation       Cardiovascular negative cardio ROS   Rhythm:Regular Rate:Normal     Neuro/Psych negative neurological ROS     GI/Hepatic negative GI ROS, Neg liver ROS,   Endo/Other  negative endocrine ROS  Renal/GU negative Renal ROS     Musculoskeletal   Abdominal   Peds  Hematology  (+) anemia ,   Anesthesia Other Findings   Reproductive/Obstetrics                           Lab Results  Component Value Date   WBC 7.0 12/28/2018   HGB 9.8 (L) 12/28/2018   HCT 33.9 (L) 12/28/2018   MCV 70.6 (L) 12/28/2018   PLT 417 (H) 12/28/2018   No results found for: CREATININE, BUN, NA, K, CL, CO2 COVID-19 Labs  No results for input(s): DDIMER, FERRITIN, LDH, CRP in the last 72 hours.  Lab Results  Component Value Date   [place] NEGATIVE 12/25/2018    Anesthesia Physical Anesthesia Plan  ASA: II  Anesthesia Plan: General   Post-op Pain Management:    Induction: Intravenous  PONV Risk Score and Plan: 3 and Dexamethasone, Ondansetron and Treatment may vary due to age or medical condition  Airway Management Planned: Oral ETT  Additional Equipment:   Intra-op Plan:   Post-operative Plan: Extubation in OR  Informed Consent: I have reviewed the patients History and Physical, chart, labs and discussed the procedure including the risks, benefits and alternatives for the proposed anesthesia with the patient or authorized representative who has indicated his/her understanding and acceptance.     Dental advisory given  Plan Discussed with: CRNA  Anesthesia  Plan Comments:        Anesthesia Quick Evaluation

## 2018-12-28 NOTE — Patient Instructions (Signed)
DUE TO COVID-19 ONLY ONE VISITOR IS ALLOWED.  Your procedure is scheduled on: Tomorrow, Aug, 4, 2020  Report to [facility] AT  7:30 A. M.   Call this number if you have problems the morning of surgery:  [phone].   OUR ADDRESS IS [address].  WE ARE LOCATED IN THE [place]                                   [address] [address].                                     REMEMBER:  DO NOT EAT FOOD OR DRINK LIQUIDS AFTER MIDNIGHT .    BRUSH YOUR TEETH THE MORNING OF SURGERY.  TAKE THESE MEDICATIONS MORNING OF SURGERY WITH A SIP OF WATER:  None  DO NOT WEAR JEWERLY, MAKE UP, OR NAIL POLISH.  DO NOT WEAR LOTIONS, POWDERS, PERFUMES/COLOGNE OR DEODORANT.  DO NOT SHAVE FOR 24 HOURS PRIOR TO DAY OF SURGERY.  CONTACTS, GLASSES, OR DENTURES MAY NOT BE WORN TO SURGERY.                                    [facility] IS NOT RESPONSIBLE  FOR ANY BELONGINGS.                                                                    [facility] Before surgery, you can play an important role.  Because skin is not sterile, your skin needs to be as free of germs as possible.  You can reduce the number of germs on your skin by washing with CHG (chlorahexidine gluconate) soap before surgery.  CHG is an antiseptic cleaner which kills germs and bonds with the skin to continue killing germs even after washing. Please DO NOT use if you have an allergy to CHG or antibacterial soaps.  If your skin becomes reddened/irritated stop using the CHG and inform your nurse when you arrive at Short Stay. Do not shave (including legs and underarms) for at least 48 hours prior to the first CHG shower.  You may shave your face/neck.  Please follow these instructions carefully:  1.  Shower with CHG Soap the night before surgery and the  morning of surgery.  2.  If you choose to wash your hair, wash your hair first as usual with your normal  shampoo.  3.  After you shampoo, rinse your  hair and body thoroughly to remove the shampoo.                             4.  Use CHG as you would any other liquid soap.  You can apply chg directly to the skin and wash.  Gently with a scrungie or clean washcloth.  5.  Apply the CHG Soap to your body ONLY FROM THE NECK DOWN.   Do   not use on face/ open  Wound or open sores. Avoid contact with eyes, ears mouth and   genitals (private parts).                       Wash face,  Genitals (private parts) with your normal soap.             6.  Wash thoroughly, paying special attention to the area where your    surgery  will be performed.  7.  Thoroughly rinse your body with warm water from the neck down.  8.  DO NOT shower/wash with your normal soap after using and rinsing off the CHG Soap.                9.  Pat yourself dry with a clean towel.            10.  Wear clean pajamas.            11.  Place clean sheets on your bed the night of your first shower and do not  sleep with pets. Day of Surgery : Do not apply any lotions/deodorants the morning of surgery.  Please wear clean clothes to the hospital/surgery center.  FAILURE TO FOLLOW THESE INSTRUCTIONS MAY RESULT IN THE CANCELLATION OF YOUR SURGERY  PATIENT SIGNATURE_________________________________  NURSE SIGNATURE__________________________________  ________________________________________________________________________

## 2018-12-29 ENCOUNTER — Observation Stay (HOSPITAL_BASED_OUTPATIENT_CLINIC_OR_DEPARTMENT_OTHER): Payer: BC Managed Care – PPO | Admitting: Anesthesiology

## 2018-12-29 ENCOUNTER — Encounter (HOSPITAL_BASED_OUTPATIENT_CLINIC_OR_DEPARTMENT_OTHER): Payer: Self-pay

## 2018-12-29 ENCOUNTER — Ambulatory Visit (HOSPITAL_BASED_OUTPATIENT_CLINIC_OR_DEPARTMENT_OTHER)
Admission: RE | Admit: 2018-12-29 | Discharge: 2018-12-29 | Disposition: A | Discharge status: Home / Self Care | Payer: BC Managed Care – PPO | Attending: Obstetrics & Gynecology | Admitting: Obstetrics & Gynecology

## 2018-12-29 ENCOUNTER — Encounter (HOSPITAL_BASED_OUTPATIENT_CLINIC_OR_DEPARTMENT_OTHER)
Admission: RE | Disposition: A | Discharge status: Home / Self Care | Payer: Self-pay | Source: Home / Self Care | Attending: Obstetrics & Gynecology

## 2018-12-29 DIAGNOSIS — D509 Iron deficiency anemia, unspecified: Secondary | ICD-10-CM | POA: Insufficient documentation

## 2018-12-29 DIAGNOSIS — Z79899 Other long term (current) drug therapy: Secondary | ICD-10-CM | POA: Diagnosis not present

## 2018-12-29 DIAGNOSIS — Z9350 Unspecified cystostomy status: Secondary | ICD-10-CM

## 2018-12-29 DIAGNOSIS — Z9889 Other specified postprocedural states: Secondary | ICD-10-CM

## 2018-12-29 DIAGNOSIS — N9971 Accidental puncture and laceration of a genitourinary system organ or structure during a genitourinary system procedure: Secondary | ICD-10-CM | POA: Diagnosis not present

## 2018-12-29 DIAGNOSIS — D219 Benign neoplasm of connective and other soft tissue, unspecified: Secondary | ICD-10-CM

## 2018-12-29 DIAGNOSIS — N939 Abnormal uterine and vaginal bleeding, unspecified: Secondary | ICD-10-CM

## 2018-12-29 DIAGNOSIS — D259 Leiomyoma of uterus, unspecified: Secondary | ICD-10-CM | POA: Insufficient documentation

## 2018-12-29 DIAGNOSIS — Y658 Other specified misadventures during surgical and medical care: Secondary | ICD-10-CM | POA: Insufficient documentation

## 2018-12-29 HISTORY — PX: BLADDER REPAIR: SHX6721

## 2018-12-29 HISTORY — PX: ROBOTIC ASSISTED TOTAL HYSTERECTOMY: SHX6085

## 2018-12-29 LAB — TYPE AND SCREEN
ABO/RH(D): B POS
Antibody Screen: NEGATIVE

## 2018-12-29 LAB — POCT PREGNANCY, URINE: Preg Test, Ur: NEGATIVE

## 2018-12-29 SURGERY — HYSTERECTOMY, TOTAL, ROBOT-ASSISTED
Anesthesia: General | Site: Bladder | Laterality: Bilateral

## 2018-12-29 MED ORDER — INTEGRA F 125-1 MG PO CAPS: 1.0000 | ORAL_CAPSULE | Freq: Every day | ORAL | 3 refills | Status: AC

## 2018-12-29 MED ORDER — PHENAZOPYRIDINE HCL 200 MG PO TABS: 200.0000 mg | ORAL_TABLET | Freq: Three times a day (TID) | ORAL | 1 refills | Status: AC | PRN

## 2018-12-29 MED ORDER — PROPOFOL 10 MG/ML IV BOLUS
INTRAVENOUS | Status: DC | PRN
  Administered 2018-12-29: 120 mg via INTRAVENOUS

## 2018-12-29 MED ORDER — ZOLPIDEM TARTRATE 5 MG PO TABS
5.0000 mg | ORAL_TABLET | Freq: Every evening | ORAL | Status: DC | PRN
  Filled 2018-12-29: qty 1

## 2018-12-29 MED ORDER — CEFAZOLIN SODIUM-DEXTROSE 2-4 GM/100ML-% IV SOLN
INTRAVENOUS | Status: AC
  Filled 2018-12-29: qty 100

## 2018-12-29 MED ORDER — ACETAMINOPHEN 500 MG PO TABS
1000.0000 mg | ORAL_TABLET | ORAL | Status: AC
  Administered 2018-12-29: 1000 mg via ORAL
  Filled 2018-12-29: qty 2

## 2018-12-29 MED ORDER — GABAPENTIN 300 MG PO CAPS
ORAL_CAPSULE | ORAL | Status: AC
  Filled 2018-12-29: qty 1

## 2018-12-29 MED ORDER — ONDANSETRON HCL 4 MG/2ML IJ SOLN
4.0000 mg | Freq: Four times a day (QID) | INTRAMUSCULAR | Status: DC | PRN
  Filled 2018-12-29: qty 2

## 2018-12-29 MED ORDER — DOCUSATE SODIUM 100 MG PO CAPS
100.0000 mg | ORAL_CAPSULE | Freq: Two times a day (BID) | ORAL | Status: DC
  Administered 2018-12-29: 18:00:00 100 mg via ORAL
  Filled 2018-12-29: qty 1

## 2018-12-29 MED ORDER — METHYLENE BLUE 0.5 % INJ SOLN
INTRAVENOUS | Status: AC
  Filled 2018-12-29: qty 10

## 2018-12-29 MED ORDER — PROPOFOL 10 MG/ML IV BOLUS
INTRAVENOUS | Status: AC
  Filled 2018-12-29: qty 20

## 2018-12-29 MED ORDER — ONDANSETRON HCL 4 MG PO TABS
4.0000 mg | ORAL_TABLET | Freq: Four times a day (QID) | ORAL | Status: DC | PRN
  Filled 2018-12-29: qty 1

## 2018-12-29 MED ORDER — KETOROLAC TROMETHAMINE 30 MG/ML IJ SOLN
INTRAMUSCULAR | Status: AC
  Filled 2018-12-29: qty 1

## 2018-12-29 MED ORDER — KETOROLAC TROMETHAMINE 30 MG/ML IJ SOLN
30.0000 mg | Freq: Once | INTRAMUSCULAR | Status: DC
  Filled 2018-12-29: qty 1

## 2018-12-29 MED ORDER — BISACODYL 10 MG RE SUPP
10.0000 mg | Freq: Every day | RECTAL | Status: DC | PRN
  Filled 2018-12-29: qty 1

## 2018-12-29 MED ORDER — PROMETHAZINE HCL 25 MG/ML IJ SOLN
6.2500 mg | INTRAMUSCULAR | Status: DC | PRN
  Filled 2018-12-29: qty 1

## 2018-12-29 MED ORDER — ROCURONIUM BROMIDE 10 MG/ML (PF) SYRINGE
PREFILLED_SYRINGE | INTRAVENOUS | Status: AC
  Filled 2018-12-29: qty 10

## 2018-12-29 MED ORDER — METHYLENE BLUE 0.5 % INJ SOLN
INTRAVENOUS | Status: DC | PRN
  Administered 2018-12-29: 5 mg via INTRAVENOUS

## 2018-12-29 MED ORDER — KETOROLAC TROMETHAMINE 30 MG/ML IJ SOLN
30.0000 mg | Freq: Four times a day (QID) | INTRAMUSCULAR | Status: DC
  Administered 2018-12-29: 30 mg via INTRAVENOUS
  Filled 2018-12-29: qty 1

## 2018-12-29 MED ORDER — FENTANYL CITRATE (PF) 100 MCG/2ML IJ SOLN
INTRAMUSCULAR | Status: DC | PRN
  Administered 2018-12-29 (×5): 50 ug via INTRAVENOUS

## 2018-12-29 MED ORDER — DOCUSATE SODIUM 100 MG PO CAPS
ORAL_CAPSULE | ORAL | Status: AC
  Filled 2018-12-29: qty 1

## 2018-12-29 MED ORDER — DEXTROSE-NACL 5-0.45 % IV SOLN
INTRAVENOUS | Status: DC
  Filled 2018-12-29: qty 1000

## 2018-12-29 MED ORDER — PANTOPRAZOLE SODIUM 40 MG PO TBEC
DELAYED_RELEASE_TABLET | ORAL | Status: AC
  Filled 2018-12-29: qty 1

## 2018-12-29 MED ORDER — SUGAMMADEX SODIUM 200 MG/2ML IV SOLN
INTRAVENOUS | Status: DC | PRN
  Administered 2018-12-29: 120 mg via INTRAVENOUS

## 2018-12-29 MED ORDER — DEXAMETHASONE SODIUM PHOSPHATE 10 MG/ML IJ SOLN
INTRAMUSCULAR | Status: DC | PRN
  Administered 2018-12-29: 10 mg via INTRAVENOUS

## 2018-12-29 MED ORDER — HYDROMORPHONE HCL 1 MG/ML IJ SOLN
INTRAMUSCULAR | Status: DC | PRN
  Administered 2018-12-29 (×2): 0.5 mg via INTRAVENOUS

## 2018-12-29 MED ORDER — FENTANYL CITRATE (PF) 100 MCG/2ML IJ SOLN
25.0000 ug | INTRAMUSCULAR | Status: DC | PRN
  Administered 2018-12-29: 25 ug via INTRAVENOUS
  Administered 2018-12-29: 14:00:00 50 ug via INTRAVENOUS
  Administered 2018-12-29: 25 ug via INTRAVENOUS
  Filled 2018-12-29: qty 1

## 2018-12-29 MED ORDER — MIDAZOLAM HCL 5 MG/5ML IJ SOLN
INTRAMUSCULAR | Status: DC | PRN
  Administered 2018-12-29: 2 mg via INTRAVENOUS

## 2018-12-29 MED ORDER — DEXAMETHASONE SODIUM PHOSPHATE 10 MG/ML IJ SOLN
INTRAMUSCULAR | Status: AC
  Filled 2018-12-29: qty 1

## 2018-12-29 MED ORDER — OXYBUTYNIN CHLORIDE 5 MG PO TABS: 5.0000 mg | ORAL_TABLET | Freq: Three times a day (TID) | ORAL | 1 refills | Status: AC | PRN

## 2018-12-29 MED ORDER — SULFAMETHOXAZOLE-TRIMETHOPRIM 800-160 MG PO TABS: 1.0000 | ORAL_TABLET | Freq: Two times a day (BID) | ORAL | 0 refills | Status: AC

## 2018-12-29 MED ORDER — SODIUM CHLORIDE 0.9 % IR SOLN
Status: DC | PRN
  Administered 2018-12-29: 3000 mL
  Administered 2018-12-29: 1000 mL

## 2018-12-29 MED ORDER — HYDROMORPHONE HCL 1 MG/ML IJ SOLN
0.2000 mg | INTRAMUSCULAR | Status: DC | PRN
  Filled 2018-12-29: qty 1

## 2018-12-29 MED ORDER — ACETAMINOPHEN 500 MG PO TABS
ORAL_TABLET | ORAL | Status: AC
  Filled 2018-12-29: qty 2

## 2018-12-29 MED ORDER — OXYCODONE-ACETAMINOPHEN 5-325 MG PO TABS: 1.0000 | ORAL_TABLET | ORAL | 0 refills | Status: AC | PRN

## 2018-12-29 MED ORDER — ONDANSETRON HCL 4 MG/2ML IJ SOLN
INTRAMUSCULAR | Status: AC
  Filled 2018-12-29: qty 2

## 2018-12-29 MED ORDER — TRAMADOL HCL 50 MG PO TABS
50.0000 mg | ORAL_TABLET | Freq: Four times a day (QID) | ORAL | Status: DC | PRN
  Filled 2018-12-29: qty 1

## 2018-12-29 MED ORDER — LIDOCAINE 2% (20 MG/ML) 5 ML SYRINGE
INTRAMUSCULAR | Status: DC | PRN
  Administered 2018-12-29: 60 mg via INTRAVENOUS

## 2018-12-29 MED ORDER — POLYETHYLENE GLYCOL 3350 17 G PO PACK
17.0000 g | PACK | Freq: Every day | ORAL | Status: DC | PRN
  Filled 2018-12-29: qty 1

## 2018-12-29 MED ORDER — FENTANYL CITRATE (PF) 100 MCG/2ML IJ SOLN
INTRAMUSCULAR | Status: AC
  Filled 2018-12-29: qty 2

## 2018-12-29 MED ORDER — BUPIVACAINE HCL (PF) 0.5 % IJ SOLN
INTRAMUSCULAR | Status: DC | PRN
  Administered 2018-12-29: 30 mL

## 2018-12-29 MED ORDER — KETOROLAC TROMETHAMINE 30 MG/ML IJ SOLN
INTRAMUSCULAR | Status: DC | PRN
  Administered 2018-12-29: 30 mg via INTRAVENOUS

## 2018-12-29 MED ORDER — MIDAZOLAM HCL 2 MG/2ML IJ SOLN
INTRAMUSCULAR | Status: AC
  Filled 2018-12-29: qty 2

## 2018-12-29 MED ORDER — OXYCODONE-ACETAMINOPHEN 5-325 MG PO TABS
ORAL_TABLET | ORAL | Status: AC
  Filled 2018-12-29: qty 1

## 2018-12-29 MED ORDER — ONDANSETRON HCL 4 MG/2ML IJ SOLN
INTRAMUSCULAR | Status: DC | PRN
  Administered 2018-12-29 (×2): 4 mg via INTRAVENOUS

## 2018-12-29 MED ORDER — CEFAZOLIN SODIUM-DEXTROSE 2-4 GM/100ML-% IV SOLN
2.0000 g | INTRAVENOUS | Status: AC
  Administered 2018-12-29: 2 g via INTRAVENOUS
  Filled 2018-12-29: qty 100

## 2018-12-29 MED ORDER — FENTANYL CITRATE (PF) 250 MCG/5ML IJ SOLN
INTRAMUSCULAR | Status: AC
  Filled 2018-12-29: qty 5

## 2018-12-29 MED ORDER — SIMETHICONE 80 MG PO CHEW
80.0000 mg | CHEWABLE_TABLET | Freq: Four times a day (QID) | ORAL | Status: DC | PRN
  Filled 2018-12-29: qty 1

## 2018-12-29 MED ORDER — IBUPROFEN 800 MG PO TABS
800.0000 mg | ORAL_TABLET | Freq: Four times a day (QID) | ORAL | Status: DC
  Filled 2018-12-29: qty 1

## 2018-12-29 MED ORDER — GABAPENTIN 300 MG PO CAPS
300.0000 mg | ORAL_CAPSULE | ORAL | Status: AC
  Administered 2018-12-29: 300 mg via ORAL
  Filled 2018-12-29: qty 1

## 2018-12-29 MED ORDER — PANTOPRAZOLE SODIUM 40 MG PO TBEC
40.0000 mg | DELAYED_RELEASE_TABLET | Freq: Every day | ORAL | Status: DC
  Administered 2018-12-29: 18:00:00 40 mg via ORAL
  Filled 2018-12-29: qty 1

## 2018-12-29 MED ORDER — SOD CITRATE-CITRIC ACID 500-334 MG/5ML PO SOLN
ORAL | Status: AC
  Filled 2018-12-29: qty 30

## 2018-12-29 MED ORDER — HYDROMORPHONE HCL 2 MG/ML IJ SOLN
INTRAMUSCULAR | Status: AC
  Filled 2018-12-29: qty 1

## 2018-12-29 MED ORDER — MENTHOL 3 MG MT LOZG
1.0000 | LOZENGE | OROMUCOSAL | Status: DC | PRN
  Filled 2018-12-29: qty 9

## 2018-12-29 MED ORDER — LIDOCAINE 2% (20 MG/ML) 5 ML SYRINGE
INTRAMUSCULAR | Status: AC
  Filled 2018-12-29: qty 5

## 2018-12-29 MED ORDER — ROCURONIUM BROMIDE 100 MG/10ML IV SOLN
INTRAVENOUS | Status: DC | PRN
  Administered 2018-12-29: 40 mg via INTRAVENOUS
  Administered 2018-12-29: 5 mg via INTRAVENOUS

## 2018-12-29 MED ORDER — IBUPROFEN 800 MG PO TABS: 800.0000 mg | ORAL_TABLET | Freq: Three times a day (TID) | ORAL | 3 refills | Status: AC | PRN

## 2018-12-29 MED ORDER — OXYCODONE-ACETAMINOPHEN 5-325 MG PO TABS
1.0000 | ORAL_TABLET | ORAL | Status: DC | PRN
  Administered 2018-12-29: 17:00:00 1 via ORAL
  Filled 2018-12-29: qty 2

## 2018-12-29 MED ORDER — LACTATED RINGERS IV SOLN
INTRAVENOUS | Status: DC
  Administered 2018-12-29: 125 mL/h via INTRAVENOUS
  Administered 2018-12-29: 12:00:00 via INTRAVENOUS
  Filled 2018-12-29: qty 1000

## 2018-12-29 MED ORDER — DOCUSATE SODIUM 100 MG PO CAPS: 100.0000 mg | ORAL_CAPSULE | Freq: Two times a day (BID) | ORAL | 0 refills | Status: AC

## 2018-12-29 MED ORDER — SOD CITRATE-CITRIC ACID 500-334 MG/5ML PO SOLN
30.0000 mL | ORAL | Status: AC
  Administered 2018-12-29: 30 mL via ORAL
  Filled 2018-12-29: qty 30

## 2018-12-29 SURGICAL SUPPLY — 83 items
APPLICATOR ARISTA FLEXITIP XL (MISCELLANEOUS) IMPLANT
APPLIER CLIP 5 13 M/L LIGAMAX5 (MISCELLANEOUS)
BARRIER ADHS 3X4 INTERCEED (GAUZE/BANDAGES/DRESSINGS) IMPLANT
Bard  Wound Evacuator Kit ×5 IMPLANT
CANISTER SUCT 3000ML PPV (MISCELLANEOUS) ×5 IMPLANT
CATH FOLEY 3WAY  5CC 16FR (CATHETERS) ×2
CATH FOLEY 3WAY 5CC 16FR (CATHETERS) ×3 IMPLANT
CLIP APPLIE 5 13 M/L LIGAMAX5 (MISCELLANEOUS) IMPLANT
COVER BACK TABLE 60X90IN (DRAPES) ×5 IMPLANT
COVER TIP SHEARS 8 DVNC (MISCELLANEOUS) ×3 IMPLANT
COVER TIP SHEARS 8MM DA VINCI (MISCELLANEOUS) ×2
DECANTER SPIKE VIAL GLASS SM (MISCELLANEOUS) ×5 IMPLANT
DEFOGGER SCOPE WARMER CLEARIFY (MISCELLANEOUS) ×5 IMPLANT
DERMABOND ADVANCED (GAUZE/BANDAGES/DRESSINGS) ×2
DERMABOND ADVANCED .7 DNX12 (GAUZE/BANDAGES/DRESSINGS) ×3 IMPLANT
DRAIN CHANNEL 19F RND (DRAIN) IMPLANT
DRAPE ARM DVNC X/XI (DISPOSABLE) ×9 IMPLANT
DRAPE COLUMN DVNC XI (DISPOSABLE) ×3 IMPLANT
DRAPE DA VINCI XI ARM (DISPOSABLE) ×6
DRAPE DA VINCI XI COLUMN (DISPOSABLE) ×2
DURAPREP 26ML APPLICATOR (WOUND CARE) ×10 IMPLANT
ELECT REM PT RETURN 9FT ADLT (ELECTROSURGICAL) ×5
ELECTRODE REM PT RTRN 9FT ADLT (ELECTROSURGICAL) ×3 IMPLANT
EVACUATOR SILICONE 100CC (DRAIN) ×5 IMPLANT
GLOVE BIO SURGEON STRL SZ7 (GLOVE) ×20 IMPLANT
GLOVE BIO SURGEON STRL SZ7.5 (GLOVE) ×10 IMPLANT
GLOVE BIOGEL PI IND STRL 7.0 (GLOVE) ×21 IMPLANT
GLOVE BIOGEL PI INDICATOR 7.0 (GLOVE) ×14
GYRUS RUMI II 2.5CM BLUE (DISPOSABLE)
GYRUS RUMI II 3.5CM BLUE (DISPOSABLE)
GYRUS RUMI II 4.0CM BLUE (DISPOSABLE)
HEMOSTAT ARISTA ABSORB 3G PWDR (HEMOSTASIS) IMPLANT
IRRIG SUCT STRYKERFLOW 2 WTIP (MISCELLANEOUS) ×5
IRRIGATION SUCT STRKRFLW 2 WTP (MISCELLANEOUS) ×3 IMPLANT
LEGGING LITHOTOMY PAIR STRL (DRAPES) ×5 IMPLANT
NEEDLE INSUFFLATION 120MM (ENDOMECHANICALS) ×5 IMPLANT
OBTURATOR OPTICAL STANDARD 8MM (TROCAR) ×2
OBTURATOR OPTICAL STND 8 DVNC (TROCAR) ×3
OBTURATOR OPTICALSTD 8 DVNC (TROCAR) ×3 IMPLANT
OCCLUDER COLPOPNEUMO (BALLOONS) ×5 IMPLANT
PACK ROBOT WH (CUSTOM PROCEDURE TRAY) ×5 IMPLANT
PACK ROBOTIC GOWN (GOWN DISPOSABLE) ×5 IMPLANT
PACK TRENDGUARD 450 HYBRID PRO (MISCELLANEOUS) ×3 IMPLANT
PAD PREP 24X48 CUFFED NSTRL (MISCELLANEOUS) ×5 IMPLANT
PROTECTOR NERVE ULNAR (MISCELLANEOUS) ×10 IMPLANT
RUMI II 3.0CM BLUE KOH-EFFICIE (DISPOSABLE) IMPLANT
RUMI II GYRUS 2.5CM BLUE (DISPOSABLE) IMPLANT
RUMI II GYRUS 3.5CM BLUE (DISPOSABLE) IMPLANT
RUMI II GYRUS 4.0CM BLUE (DISPOSABLE) IMPLANT
SEAL CANN UNIV 5-8 DVNC XI (MISCELLANEOUS) ×9 IMPLANT
SEAL XI 5MM-8MM UNIVERSAL (MISCELLANEOUS) ×6
SEALER VESSEL DA VINCI XI (MISCELLANEOUS) ×2
SEALER VESSEL EXT DVNC XI (MISCELLANEOUS) ×3 IMPLANT
SET CYSTO W/LG BORE CLAMP LF (SET/KITS/TRAYS/PACK) ×10 IMPLANT
SET TRI-LUMEN FLTR TB AIRSEAL (TUBING) ×5 IMPLANT
SUT SILK 2 0 SH (SUTURE) ×5 IMPLANT
SUT VIC AB 0 CT1 27 (SUTURE) ×6
SUT VIC AB 0 CT1 27XBRD ANBCTR (SUTURE) ×9 IMPLANT
SUT VIC AB 3-0 SH 27 (SUTURE) ×4
SUT VIC AB 3-0 SH 27X BRD (SUTURE) ×6 IMPLANT
SUT VICRYL 0 UR6 27IN ABS (SUTURE) IMPLANT
SUT VICRYL 4-0 PS2 18IN ABS (SUTURE) ×15 IMPLANT
SUT VLOC 180 0 9IN  GS21 (SUTURE) ×2
SUT VLOC 180 0 9IN GS21 (SUTURE) ×3 IMPLANT
SUT VLOC 180 2-0 6IN GS21 (SUTURE) ×10 IMPLANT
SUT VLOC BARB 180 ABS3/0GR12 (SUTURE) ×5
SUTURE VLOC BRB 180 ABS3/0GR12 (SUTURE) ×3 IMPLANT
SYR 10ML LL (SYRINGE) ×5 IMPLANT
SYRINGE IRR TOOMEY STRL 70CC (SYRINGE) ×5 IMPLANT
SYSTEM CARTER THOMASON II (TROCAR) IMPLANT
TIP RUMI ORANGE 6.7MMX12CM (TIP) ×5 IMPLANT
TIP UTERINE 5.1X6CM LAV DISP (MISCELLANEOUS) IMPLANT
TIP UTERINE 6.7X10CM GRN DISP (MISCELLANEOUS) IMPLANT
TIP UTERINE 6.7X6CM WHT DISP (MISCELLANEOUS) IMPLANT
TIP UTERINE 6.7X8CM BLUE DISP (MISCELLANEOUS) IMPLANT
TOWEL OR 17X26 10 PK STRL BLUE (TOWEL DISPOSABLE) ×5 IMPLANT
TRAY FOLEY BAG SILVER LF 16FR (CATHETERS) ×5 IMPLANT
TRENDGUARD 450 HYBRID PRO PACK (MISCELLANEOUS) ×5
TROCAR BLADELESS OPT 5 100 (ENDOMECHANICALS) ×5 IMPLANT
TROCAR PORT AIRSEAL 5X120 (TROCAR) ×5 IMPLANT
TUBE FEEDING 5FR 36IN KANGAROO (TUBING) ×5 IMPLANT
TUBING INSUF HEATED (TUBING) ×5 IMPLANT
WATER STERILE IRR 1000ML POUR (IV SOLUTION) ×5 IMPLANT

## 2018-12-29 NOTE — Discharge Summary (Signed)
Physician Discharge Summary  Patient ID: Michele Harmon MRN: [id number] DOB/AGE: September 16, 1970 48 y.o.  Admit date: 12/29/2018 Discharge date: 12/29/2018  Admission Diagnoses: AUB; Uterine fibroids.   Discharge Diagnoses:  Principal Problem:   Fibroids Active Problems:   Abnormal uterine bleeding (AUB)   Post-operative state cystotomy   Discharged Condition: good  Hospital Course: Patient had an uncomplicated surgery; for further details of this surgery, please refer to the operative note. Furthermore, the patient had an uncomplicated postoperative course.  By time of discharge, her pain was controlled on oral pain medications; she was ambulating, tolerating regular diet and passing flatus.  She was deemed stable for discharge to home.  Pt initially decided to stay overnight but, after discussion with erh husband, she elected to go home on POD 0.   Consults: urology- Dr. Phebe Colla  Significant Diagnostic Studies: none  Treatments: surgery: Robot Assisted total laparoscopic hysterectomy with bilateral salpingectomy and repair of cystotomy.    Discharge Exam: Blood pressure 124/84, pulse 88, temperature 98.3 F (36.8 C), resp. rate 16, weight 59.9 kg, SpO2 100 %. Pt in NAD Abd; soft, NT, ND. LLQ JP drain with minimal drainage.   Disposition: Discharge disposition: 01-Home or Self Care       Discharge Instructions    Call MD for:  difficulty breathing, headache or visual disturbances   Complete by: As directed    Call MD for:  extreme fatigue   Complete by: As directed    Call MD for:  hives   Complete by: As directed    Call MD for:  persistant dizziness or light-headedness   Complete by: As directed    Call MD for:  persistant nausea and vomiting   Complete by: As directed    Call MD for:  redness, tenderness, or signs of infection (pain, swelling, redness, odor or green/yellow discharge around incision site)   Complete by: As directed    Call MD for:  severe uncontrolled  pain   Complete by: As directed    Call MD for:  temperature >100.4   Complete by: As directed    Catheter care   Complete by: As directed    Diet general   Complete by: As directed    Discharge wound care:   Complete by: As directed    Keep port sites clean and dry.   Drain care   Complete by: As directed    Drain instructions: keep drain to bulb suction   Antibiotics ordered: Yes   Flush Drain: No   Driving Restrictions   Complete by: As directed    No driving for 2 weeks   Increase activity slowly   Complete by: As directed    Lifting restrictions   Complete by: As directed    No heavy lifting for 4 weeks   Sexual Activity Restrictions   Complete by: As directed    Nothing per vagina for 8 weeks. NO intercourse for 8 weeks.     Allergies as of 12/29/2018   No Known Allergies     Medication List    TAKE these medications   Biotin 1000 MCG tablet Take 1,000 mcg by mouth every evening.   docusate sodium 100 MG capsule Commonly known as: COLACE Take 1 capsule (100 mg total) by mouth 2 (two) times daily.   ibuprofen 800 MG tablet Commonly known as: ADVIL Take 1 tablet (800 mg total) by mouth every 8 (eight) hours as needed for mild pain, moderate pain or cramping.  Integra F 125-1 MG Caps Take 1 capsule by mouth daily. What changed: when to take this   oxybutynin 5 MG tablet Commonly known as: DITROPAN Take 1 tablet (5 mg total) by mouth every 8 (eight) hours as needed for bladder spasms.   oxyCODONE-acetaminophen 5-325 MG tablet Commonly known as: PERCOCET/ROXICET Take 1-2 tablets by mouth every 4 (four) hours as needed for moderate pain or severe pain.   phenazopyridine 200 MG tablet Commonly known as: Pyridium Take 1 tablet (200 mg total) by mouth 3 (three) times daily as needed for pain (urethral spasm).   sulfamethoxazole-trimethoprim 800-160 MG tablet Commonly known as: BACTRIM DS Take 1 tablet by mouth 2 (two) times daily.             Discharge Care Instructions  (From admission, onward)         Start     Ordered   12/29/18 0000  Discharge wound care:    Comments: Keep port sites clean and dry.   12/29/18 [place] In 2 weeks.   Specialty: Obstetrics and Gynecology Contact information: [place] [id number] [place] In 1 day.   Specialty: Obstetrics and Gynecology Why: for drain removal. At 3pm.  Contact information: [place] [id number] [phone]          Signed: Lavonia Drafts 12/29/2018, 7:01 PM

## 2018-12-29 NOTE — Op Note (Signed)
12/29/2018  1:25 PM  PATIENT:  Michele Harmon  48 y.o. female  PRE-OPERATIVE DIAGNOSIS:  Uterine Fibroids AUB  POST-OPERATIVE DIAGNOSIS:  Uterine Fibroids, Abnormal Uterine Bleeding  PROCEDURE:  Procedure(s): XI ROBOTIC ASSISTED TOTAL HYSTERECTOMY WITH SALPINGECTOMY, Incindental Cystotomy and Repair (Bilateral) BLADDER REPAIR, XI Robotic Cystotomy Closure  SURGEON:  Surgeon(s) and Role: Panel 1:    * Lavonia Drafts, MD - Primary    * Donnamae Jude, MD - Assisting Panel 2:    Alexis Frock, MD - Primary  ANESTHESIA:   general  EBL:  50 mL   BLOOD ADMINISTERED:none  DRAINS: Penrose drain in the left lower quadrant   LOCAL MEDICATIONS USED:  MARCAINE     SPECIMEN:  Source of Specimen:  uterus, cervix and fallopian tubes    DISPOSITION OF SPECIMEN:  PATHOLOGY  COUNTS:  YES  TOURNIQUET:  * No tourniquets in log *  DICTATION: .Note written in EPIC  PLAN OF CARE: Admit for overnight observation  PATIENT DISPOSITION:  PACU - hemodynamically stable.   Delay start of Pharmacological VTE agent (>24hrs) due to surgical blood loss or risk of bleeding: not applicable  Complications: incidental cystotomy  The risks, benefits, and alternatives of surgery were explained, understood, and accepted. Consents were signed. All questions were answered. She was taken to the operating room and general anesthesia was applied without complication. She was placed in the dorsal lithotomy position and her abdomen and vagina were prepped and draped after she had been carefully positioned on the table. A bimanual exam revealed a 14 week size uterus that was mobile. Her adnexa were not enlarged. The cervix was measured and the uterus was sounded to 10 cm. A Rumi uterine manipulator was placed without difficulty. A Foley catheter was placed and it drained clear throughout the case. Gloves were changed and attention was turned to the abdomen. A 21mm incision was made in the umbilicus and a  Veress needle was placed intraperitoneally. CO2 was used to insufflate the abdomen to approximately 4 L. After good pneumoperitoneum was established, a 8 mm trocar was placed 6 cm superior to the umbilicus in the midline. Laparoscopy confirmed correct placement. She was placed in Trendelenburg position and ports were placed in appropriate positions on her abdomen to allow maximum exposure during the robotic case. Specifically there was an 74mm assistant port placed in the left upper quadrant under direct laproscopic visualization. Two 8 mm ports were placed 8cm lateral to the midline port.  These were all placed under direct laparoscopic visualization. The robot was docked and I proceeded with a robotic portion of the case.  The pelvis was inspected and the uterus was found to have fibroids and be enlarged.  The fallopian tubes and ovaries were found to be normal. The remainder of her pelvis appeared normal with the exception of adhesions of bowel to the adnexa and sidewall on the left side.  These were released sharply. The ureters and the infundibulopelvic ligaments were identified. I excised the fallopian tubes bilaterally. The round ligament on each side was cauterized and cut. The Vessel Sealer instrument was used for this portion. The round ligaments were identified, cauterized and ligated, a bladder flap was created anteriorly. The uterine vessels were identified and cauterized and then cut.The bladder was pushed out of the operative site and an anterior colpotomy was made. The colpotomy incision was extended circumferentially, following the blue outline of the Rumi manipulator. All pedicles were hemostatic.  The uterus was removed from the vagina with  the fallopian tube segments. Following removal of the uterus, a clear liquid was noted just above the open cuff. Exploration revealed 2 small incidental cystotomies. The ureter was again identified and found to be clear and uninvolved. A suture was placed to  identify the lesions. Due to its proximity to the UO, Urology was consulted.  The vaginal cuff was closed with v-lock suture.  Excellent hemostasis was noted throughout. Dr. Tresa Moore arrived and robotically repaired the cystomies. Hemostasis was again assured. Dr. Tresa Moore requested a JP drain which was placed through the LLQ port into the pelvis without difficulty.  The intraabdominal pressure was lowered assess hemostasis. After determining excellent hemostasis, the robot was undocked.  The skin from all of the other ports was closed with 3-0 vicryl. A silk tie was used to secure the JP drain in the LLQ.  A total of 30cc of 0.5% Marcaine was injected into the port sites.  The patient was then extubated and taken to recovery in stable condition.   An experienced assistant was required given the standard of surgical care given the complexity of the case.  This assistant was needed for exposure, dissection, suctioning, retraction, instrument exchange, assisting with delivery with administration of fundal pressure, and for overall help during the procedure.  Sponge, lap and needle counts were correct x 2.  Adalberto Metzgar L. Harraway-Smith, M.D., [facility]

## 2018-12-29 NOTE — Anesthesia Procedure Notes (Signed)
Procedure Name: Intubation Date/Time: 12/29/2018 9:38 AM Performed by: Gwyndolyn Saxon, CRNA Pre-anesthesia Checklist: Patient identified, Emergency Drugs available, Suction available and Patient being monitored Patient Re-evaluated:Patient Re-evaluated prior to induction Oxygen Delivery Method: Circle system utilized Preoxygenation: Pre-oxygenation with 100% oxygen Induction Type: IV induction Ventilation: Mask ventilation without difficulty Laryngoscope Size: Miller and 2 Grade View: Grade I Tube type: Oral Tube size: 7.0 mm Number of attempts: 1 Airway Equipment and Method: Stylet Placement Confirmation: ETT inserted through vocal cords under direct vision,  positive ETCO2 and breath sounds checked- equal and bilateral Secured at: 20 cm Tube secured with: Tape Dental Injury: Teeth and Oropharynx as per pre-operative assessment

## 2018-12-29 NOTE — Anesthesia Postprocedure Evaluation (Signed)
Anesthesia Post Note  Patient: Michele Harmon  Procedure(s) Performed: XI ROBOTIC ASSISTED TOTAL HYSTERECTOMY WITH SALPINGECTOMY, Incindental Cystotomy and Repair (Bilateral Abdomen) BLADDER REPAIR, XI Robotic Cystotomy Closure (Bladder)     Patient location during evaluation: PACU Anesthesia Type: General Level of consciousness: awake and alert Pain management: pain level controlled Vital Signs Assessment: post-procedure vital signs reviewed and stable Respiratory status: spontaneous breathing, nonlabored ventilation and respiratory function stable Cardiovascular status: blood pressure returned to baseline and stable Postop Assessment: no apparent nausea or vomiting Anesthetic complications: no    Last Vitals:  Vitals:   12/29/18 1345 12/29/18 1400  BP: (!) 136/124 103/79  Pulse: 88 92  Resp: 20 15  Temp:    SpO2: 100% 100%    Last Pain:  Vitals:   12/29/18 1322  TempSrc:   PainSc: 0-No pain                 Lidia Collum

## 2018-12-29 NOTE — Transfer of Care (Signed)
Immediate Anesthesia Transfer of Care Note  Patient: Michele Harmon  Procedure(s) Performed: XI ROBOTIC ASSISTED TOTAL HYSTERECTOMY WITH SALPINGECTOMY, Incindental Cystotomy and Repair (Bilateral Abdomen) BLADDER REPAIR, XI Robotic Cystotomy Closure (Bladder)  Patient Location: PACU  Anesthesia Type:General  Level of Consciousness: drowsy  Airway & Oxygen Therapy: Patient Spontanous Breathing and Patient connected to nasal cannula oxygen  Post-op Assessment: Report given to RN and Post -op Vital signs reviewed and stable  Post vital signs: Reviewed and stable  Last Vitals:  Vitals Value Taken Time  BP 115/96 12/29/18 1319  Temp 36.3 C 12/29/18 1322  Pulse 96 12/29/18 1328  Resp 13 12/29/18 1328  SpO2 100 % 12/29/18 1328  Vitals shown include unvalidated device data.  Last Pain:  Vitals:   12/29/18 0842  TempSrc:   PainSc: 0-No pain      Patients Stated Pain Goal: 3 (42/55/25 8948)  Complications: No apparent anesthesia complications

## 2018-12-29 NOTE — H&P (Signed)
Preoperative History and Physical  Michele Harmon is a 48 y.o. [id number] here for surgical management of AUB and uterine fibroids.   Proposed surgery: Robot assisted total laparoscopic hysterectomy with bilateral salpingectomy  Past Medical History:  Diagnosis Date  . Fibroids   . Iron deficiency anemia   . Vaginal Pap smear, abnormal    Past Surgical History:  Procedure Laterality Date  . CRYOTHERAPY     cervical  . LIPOSUCTION Bilateral    arms   OB History    Gravida  5   Para  2   Term  0   Preterm  2   AB  3   Living  2     SAB  0   TAB  3   Ectopic  0   Multiple  0   Live Births  2          Patient denies any cervical dysplasia or STIs. Medications Prior to Admission  Medication Sig Dispense Refill Last Dose  . Biotin 1000 MCG tablet Take 1,000 mcg by mouth every evening.    Past Week at Unknown time  . Fe Fum-FePoly-FA-Vit C-Vit B3 (INTEGRA F) 125-1 MG CAPS Take 1 capsule by mouth 2 (two) times a day. 60 capsule 6 12/28/2018 at Unknown time  . megestrol (MEGACE) 40 MG tablet Take 1 tablet (40 mg total) by mouth 2 (two) times daily. Can increase to two tablets twice a day in the event of heavy bleeding 60 tablet 6 12/28/2018 at Unknown time  . naproxen sodium (ALEVE) 220 MG tablet Take 220 mg by mouth 2 (two) times daily as needed (pain.).   12/28/2018 at Unknown time    No Known Allergies Social History:   reports that she has never smoked. She has never used smokeless tobacco. She reports current alcohol use of about 2.0 standard drinks of alcohol per week. She reports that she does not use drugs. Family History  Problem Relation Age of Onset  . Sarcoidosis Mother     Review of Systems: Noncontributory  PHYSICAL EXAM: Blood pressure (!) 131/94, pulse 81, temperature 98.2 F (36.8 C), temperature source Oral, resp. rate 16, weight 59.9 kg, SpO2 100 %. General appearance - alert, well appearing, and in no distress Chest - clear to auscultation, no  wheezes, rales or rhonchi, symmetric air entry Heart - normal rate and regular rhythm Abdomen - soft, nontender, nondistended, no masses or organomegaly Pelvic - examination not indicated Extremities - peripheral pulses normal, no pedal edema, no clubbing or cyanosis  Labs: Results for orders placed or performed during the hospital encounter of 12/29/18 (from the past 336 hour(s))  Pregnancy, urine POC   Collection Time: 12/29/18  8:12 AM  Result Value Ref Range   Preg Test, Ur NEGATIVE NEGATIVE  Results for orders placed or performed during the hospital encounter of 12/28/18 (from the past 336 hour(s))  ABO/Rh   Collection Time: 12/28/18  3:47 PM  Result Value Ref Range   ABO/RH(D)      B POS Performed at [facility], [place] [address], [place], [place] [address]   Basic metabolic panel   Collection Time: 12/28/18  3:53 PM  Result Value Ref Range   Sodium 142 135 - 145 mmol/L   Potassium 3.8 3.5 - 5.1 mmol/L   Chloride 112 (H) 98 - 111 mmol/L   CO2 24 22 - 32 mmol/L   Glucose, Bld 87 70 - 99 mg/dL   BUN 9 6 - 20  mg/dL   Creatinine, Ser 0.73 0.44 - 1.00 mg/dL   Calcium 9.8 8.9 - 10.3 mg/dL   GFR calc non Af Amer >60 >60 mL/min   GFR calc Af Amer >60 >60 mL/min   Anion gap 6 5 - 15  CBC   Collection Time: 12/28/18  3:53 PM  Result Value Ref Range   WBC 7.0 4.0 - 10.5 K/uL   RBC 4.80 3.87 - 5.11 MIL/uL   Hemoglobin 9.8 (L) 12.0 - 15.0 g/dL   HCT 33.9 (L) 36.0 - 46.0 %   MCV 70.6 (L) 80.0 - 100.0 fL   MCH 20.4 (L) 26.0 - 34.0 pg   MCHC 28.9 (L) 30.0 - 36.0 g/dL   RDW 20.9 (H) 11.5 - 15.5 %   Platelets 417 (H) 150 - 400 K/uL   nRBC 0.0 0.0 - 0.2 %  Type and screen [place]   Collection Time: 12/28/18  3:53 PM  Result Value Ref Range   ABO/RH(D) B POS    Antibody Screen NEG    Sample Expiration 01/01/2019,2359    Extend sample reason      NO TRANSFUSIONS OR PREGNANCY IN THE PAST 3 MONTHS Performed at [facility],  [place] [address], [place], [place] [address]   Results for orders placed or performed during the hospital encounter of 12/25/18 (from the past 336 hour(s))  SARS CORONAVIRUS 2 Nasal Swab   Collection Time: 12/25/18  1:56 PM   Specimen: Nasal Swab  Result Value Ref Range   SARS Coronavirus 2 NEGATIVE NEGATIVE    Imaging Studies: 07/22/2018 CLINICAL DATA:  Abnormal uterine bleeding  EXAM: ULTRASOUND PELVIS TRANSVAGINAL  TECHNIQUE: Transvaginal ultrasound examination of the pelvis was performed including evaluation of the uterus, ovaries, adnexal regions, and pelvic cul-de-sac.  COMPARISON:  None  FINDINGS: Uterus  Measurements: 13.7 x 8.3 x 11.3 cm = volume: 672 mL. Enlarged nodular uterus containing multiple leiomyomata. Large leiomyomata include a posterior mid uterine mass 6.7 x 4.5 x 5.2 cm, fundal mass 6.4 x 5.1 x 6.8 cm, and a LEFT cornual region mass 5.2 x 4.4 x 4.8 cm. Several of the leiomyomata extend submucosal.  Endometrium  Thickness: 10 mm.  No endometrial fluid or focal abnormality  Right ovary  Measurements: 3.1 x 1.7 x 3.5 cm = volume: 9.8 mL. Normal morphology without mass  Left ovary  Measurements: 2.7 x 1.3 x 2.4 cm = volume: 4.5 mL. Normal morphology without mass  Other findings:  Trace free pelvic fluid.  No adnexal masses.  IMPRESSION: Enlarged uterus containing multiple leiomyomata. Assessment: Patient Active Problem List   Diagnosis Date Noted  . Post-operative state 12/29/2018  . Fibroids 11/23/2018  . Abnormal uterine bleeding (AUB) 11/23/2018    Plan: Patient will undergo surgical management with Robot assisted total laparoscopic hysterectomy with bilateral salpingectomy.   The risks of surgery were discussed in detail with the patient including but not limited to: bleeding which may require transfusion or reoperation; infection which may require antibiotics; injury to surrounding organs which may involve bowel, bladder,  ureters ; need for additional procedures including laparoscopy or laparotomy; thromboembolic phenomenon, surgical site problems and other postoperative/anesthesia complications. Likelihood of success in alleviating the patient's condition was discussed. Routine postoperative instructions will be reviewed with the patient and her family in detail after surgery.  The patient concurred with the proposed plan, giving informed written consent for the surgery.  Patient has been NPO since last night she will remain NPO for procedure.  Anesthesia and OR aware.  Preoperative prophylactic antibiotics and SCDs ordered on call to the OR.  To OR when ready.  Skyler Dusing L. Ihor Dow, M.D., [facility] 12/29/2018 9:13 AM

## 2018-12-29 NOTE — Brief Op Note (Signed)
12/29/2018  1:25 PM  PATIENT:  Michele Harmon  48 y.o. female  PRE-OPERATIVE DIAGNOSIS:  Uterine Fibroids AUB  POST-OPERATIVE DIAGNOSIS:  Uterine Fibroids, Abnormal Uterine Bleeding  PROCEDURE:  Procedure(s): XI ROBOTIC ASSISTED TOTAL HYSTERECTOMY WITH SALPINGECTOMY, Incindental Cystotomy and Repair (Bilateral) BLADDER REPAIR, XI Robotic Cystotomy Closure  SURGEON:  Surgeon(s) and Role: Panel 1:    * Lavonia Drafts, MD - Primary    * Donnamae Jude, MD - Assisting Panel 2:    Alexis Frock, MD - Primary  ANESTHESIA:   general  EBL:  50 mL   BLOOD ADMINISTERED:none  DRAINS: Penrose drain in the left lower quadrant   LOCAL MEDICATIONS USED:  MARCAINE     SPECIMEN:  Source of Specimen:  uterus, cervix and fallopian tubes    DISPOSITION OF SPECIMEN:  PATHOLOGY  COUNTS:  YES  TOURNIQUET:  * No tourniquets in log *  DICTATION: .Note written in EPIC  PLAN OF CARE: Admit for overnight observation  PATIENT DISPOSITION:  PACU - hemodynamically stable.   Delay start of Pharmacological VTE agent (>24hrs) due to surgical blood loss or risk of bleeding: not applicable  Complications: incidental cystotomy   Robinette Esters L. Harraway-Smith, M.D., [facility]

## 2018-12-29 NOTE — Discharge Instructions (Signed)
JP Drain [employer] this sheet to all of your post-operative appointments while you have your drains.  Please measure your drains by CC's or ML's.  Make sure you drain and measure your JP Drains 3 times per day.  At the end of each day, add up totals for the left side and add up totals for the right side.    ( 9 am )     ( 3 pm )        ( 9 pm )                Date L  R  L  R  L  R  Total L/R                                                                                                                                                                                      Total Laparoscopic Hysterectomy, Care After This sheet gives you information about how to care for yourself after your procedure. Your health care provider may also give you more specific instructions. If you have problems or questions, contact your health care provider. What can I expect after the procedure? After the procedure, it is common to have:  Pain and bruising around your incisions.  A sore throat, if a breathing tube was used during surgery.  Fatigue.  Poor appetite.  Less interest in sex. If your ovaries were also removed, it is also common to have symptoms of menopause such as hot flashes, night sweats, and lack of sleep (insomnia). Follow these instructions at home: Bathing  Do not take baths, swim, or use a hot tub until your health care provider approves. You may need to only take showers for 2-3 weeks.  Keep your bandage (dressing) dry until your health care provider says it can be removed. Incision care   Follow instructions from your health care provider about how to take care of your incisions. Make sure you: ? Wash your hands with soap and water before you change your dressing. If soap and water are not available, use hand sanitizer. ? Change your dressing as told by your health care provider. ? Leave stitches (sutures), skin glue, or adhesive strips in place. These skin  closures may need to stay in place for 2 weeks or longer. If adhesive strip edges start to loosen and curl up, you may trim the loose edges. Do not remove adhesive strips completely unless your health care provider tells you to do that.  Check your incision area every day for signs of infection. Check for: ? Redness, swelling, or pain. ? Fluid or blood. ? Warmth. ? Pus  or a bad smell. Activity  Get plenty of rest and sleep.  Do not lift anything that is heavier than 10 lbs (4.5 kg) for one month after surgery, or as long as told by your health care provider.  Do not drive or use heavy machinery while taking prescription pain medicine.  Do not drive for 24 hours if you were given a medicine to help you relax (sedative).  Return to your normal activities as told by your health care provider. Ask your health care provider what activities are safe for you. Lifestyle   Do not use any products that contain nicotine or tobacco, such as cigarettes and e-cigarettes. These can delay healing. If you need help quitting, ask your health care provider.  Do not drink alcohol until your health care provider approves. General instructions  Do not douche, use tampons, or have sex for at least 6 weeks, or as told by your health care provider.  Take over-the-counter and prescription medicines only as told by your health care provider.  To monitor yourself for a fever, take your temperature at least once a day during recovery.  If you struggle with physical or emotional changes after your procedure, speak with your health care provider or a therapist.  To prevent or treat constipation while you are taking prescription pain medicine, your health care provider may recommend that you: ? Drink enough fluid to keep your urine clear or pale yellow. ? Take over-the-counter or prescription medicines. ? Eat foods that are high in fiber, such as fresh fruits and vegetables, whole grains, and beans. ? Limit  foods that are high in fat and processed sugars, such as fried and sweet foods.  Keep all follow-up visits as told by your health care provider. This is important. Contact a health care provider if:  You have chills or a fever.  You have redness, swelling, or pain around an incision.  You have fluid or blood coming from an incision.  Your incision feels warm to the touch.  You have pus or a bad smell coming from an incision.  An incision breaks open.  You feel dizzy or light-headed.  You have pain or bleeding when you urinate.  You have diarrhea, nausea, or vomiting that does not go away.  You have abnormal vaginal discharge.  You have a rash.  You have pain that does not get better with medicine. Get help right away if:  You have a fever and your symptoms suddenly get worse.  You have severe abdominal pain.  You have chest pain.  You have shortness of breath.  You faint.  You have pain, swelling, or redness on your leg.  You have heavy vaginal bleeding with blood clots. Summary  After the procedure it is common to have abdominal pain. Your provider will give you medication for this.  Do not take baths, swim, or use a hot tub until your health care provider approves.  Do not lift anything that is heavier than 10 lbs (4.5 kg) for one month after surgery, or as long as told by your health care provider.  Notify your provider if you have any signs or symptoms of infection after the procedure. This information is not intended to replace advice given to you by your health care provider. Make sure you discuss any questions you have with your health care provider. Document Released: 03/03/2013 Document Revised: 04/25/2017 Document Reviewed: 07/24/2016 Elsevier Patient Education  [place], Adult An indwelling urinary  catheter is a thin tube that is put into your bladder. The tube helps to drain pee (urine) out of your body.  The tube goes in through your urethra. Your urethra is where pee comes out of your body. Your pee will come out through the catheter, then it will go into a bag (drainage bag). Take good care of your catheter so it will work well. How to wear your catheter and bag Supplies needed  Sticky tape (adhesive tape) or a leg strap.  Alcohol wipe or soap and water (if you use tape).  A clean towel (if you use tape).  Large overnight bag.  Smaller bag (leg bag). Wearing your catheter Attach your catheter to your leg with tape or a leg strap.  Make sure the catheter is not pulled tight.  If a leg strap gets wet, take it off and put on a dry strap.  If you use tape to hold the bag on your leg: 1. Use an alcohol wipe or soap and water to wash your skin where the tape made it sticky before. 2. Use a clean towel to pat-dry that skin. 3. Use new tape to make the bag stay on your leg. Wearing your bags You should have been given a large overnight bag.  You may wear the overnight bag in the day or night.  Always have the overnight bag lower than your bladder.  Do not let the bag touch the floor.  Before you go to sleep, put a clean plastic bag in a wastebasket. Then hang the overnight bag inside the wastebasket. You should also have a smaller leg bag that fits under your clothes.  Always wear the leg bag below your knee.  Do not wear your leg bag at night. How to care for your skin and catheter Supplies needed  A clean washcloth.  Water and mild soap.  A clean towel. Caring for your skin and catheter      Clean the skin around your catheter every day: ? Wash your hands with soap and water. ? Wet a clean washcloth in warm water and mild soap. ? Clean the skin around your urethra. ? If you are female: ? Gently spread the folds of skin around your vagina (labia). ? With the washcloth in your other hand, wipe the inner side of your labia on each side. Wipe from front to back. ? If  you are female: ? Pull back any skin that covers the end of your penis (foreskin). ? With the washcloth in your other hand, wipe your penis in small circles. Start wiping at the tip of your penis, then move away from the catheter. ? Move the foreskin back in place, if needed. ? With your free hand, hold the catheter close to where it goes into your body. ? Keep holding the catheter during cleaning so it does not get pulled out. ? With the washcloth in your other hand, clean the catheter. ? Only wipe downward on the catheter. ? Do not wipe upward toward your body. Doing this may push germs into your urethra and cause infection. ? Use a clean towel to pat-dry the catheter and the skin around it. Make sure to wipe off all soap. ? Wash your hands with soap and water.  Shower every day. Do not take baths.  Do not use cream, ointment, or lotion on the area where the catheter goes into your body, unless your doctor tells you to.  Do not use powders, sprays,  or lotions on your genital area.  Check your skin around the catheter every day for signs of infection. Check for: ? Redness, swelling, or pain. ? Fluid or blood. ? Warmth. ? Pus or a bad smell. How to empty the bag Supplies needed  Rubbing alcohol.  Gauze pad or cotton ball.  Tape or a leg strap. Emptying the bag Pour the pee out of your bag when it is ?- full, or at least 2-3 times a day. Do this for your overnight bag and your leg bag. 1. Wash your hands with soap and water. 2. Separate (detach) the bag from your leg. 3. Hold the bag over the toilet or a clean pail. Keep the bag lower than your hips and bladder. This is so the pee (urine) does not go back into the tube. 4. Open the pour spout. It is at the bottom of the bag. 5. Empty the pee into the toilet or pail. Do not let the pour spout touch any surface. 6. Put rubbing alcohol on a gauze pad or cotton ball. 7. Use the gauze pad or cotton ball to clean the pour  spout. 8. Close the pour spout. 9. Attach the bag to your leg with tape or a leg strap. 10. Wash your hands with soap and water. Follow instructions for cleaning the drainage bag:  From the product maker.  As told by your doctor. How to change the bag Supplies needed  Alcohol wipes.  A clean bag.  Tape or a leg strap. Changing the bag Replace your bag when it starts to leak, smell bad, or look dirty. 1. Wash your hands with soap and water. 2. Separate the dirty bag from your leg. 3. Pinch the catheter with your fingers so that pee does not spill out. 4. Separate the catheter tube from the bag tube where these tubes connect (at the connection valve). Do not let the tubes touch any surface. 5. Clean the end of the catheter tube with an alcohol wipe. Use a different alcohol wipe to clean the end of the bag tube. 6. Connect the catheter tube to the tube of the clean bag. 7. Attach the clean bag to your leg with tape or a leg strap. Do not make the bag tight on your leg. 8. Wash your hands with soap and water. General rules   Never pull on your catheter. Never try to take it out. Doing that can hurt you.  Always wash your hands before and after you touch your catheter or bag. Use a mild, fragrance-free soap. If you do not have soap and water, use hand sanitizer.  Always make sure there are no twists or bends (kinks) in the catheter tube.  Always make sure there are no leaks in the catheter or bag.  Drink enough fluid to keep your pee pale yellow.  Do not take baths, swim, or use a hot tub.  If you are female, wipe from front to back after you poop (have a bowel movement). Contact a doctor if:  Your pee is cloudy.  Your pee smells worse than usual.  Your catheter gets clogged.  Your catheter leaks.  Your bladder feels full. Get help right away if:  You have redness, swelling, or pain where the catheter goes into your body.  You have fluid, blood, pus, or a bad  smell coming from the area where the catheter goes into your body.  Your skin feels warm where the catheter goes into your body.  You have a fever.  You have pain in your: ? Belly (abdomen). ? Legs. ? Lower back. ? Bladder.  You see blood in the catheter.  Your pee is pink or red.  You feel sick to your stomach (nauseous).  You throw up (vomit).  You have chills.  Your pee is not draining into the bag.  Your catheter gets pulled out. Summary  An indwelling urinary catheter is a thin tube that is placed into the bladder to help drain pee (urine) out of the body.  The catheter is placed into the part of the body that drains pee from the bladder (urethra).  Taking good care of your catheter will keep it working properly and help prevent problems.  Always wash your hands before and after touching your catheter or bag.  Never pull on your catheter or try to take it out. This information is not intended to replace advice given to you by your health care provider. Make sure you discuss any questions you have with your health care provider. Document Released: 09/07/2012 Document Revised: 09/04/2018 Document Reviewed: 12/27/2016 Elsevier Patient Education  2020 [employer].

## 2018-12-29 NOTE — Consult Note (Signed)
Reason for Consult: cystotomy  Referring Physician: Lavonia Drafts MD  Michele Harmon is an 48 y.o. female.   HPI:   1 - Cystotomy - 2.5cm dome / Rt trigone cystotomy noted duirng hysterectomy and emergent intraoperative consultation requested. Chart review and discussion with consulting MD w/o prior urologic history or known GU malformation.    Past Medical History:  Diagnosis Date  . Fibroids   . Iron deficiency anemia   . Vaginal Pap smear, abnormal     Past Surgical History:  Procedure Laterality Date  . CRYOTHERAPY     cervical  . LIPOSUCTION Bilateral    arms    Family History  Problem Relation Age of Onset  . Sarcoidosis Mother     Social History:  reports that she has never smoked. She has never used smokeless tobacco. She reports current alcohol use of about 2.0 standard drinks of alcohol per week. She reports that she does not use drugs.  Allergies: No Known Allergies  Medications: I have reviewed the patient's current medications.  Results for orders placed or performed during the hospital encounter of 12/29/18 (from the past 48 hour(s))  Pregnancy, urine POC     Status: None   Collection Time: 12/29/18  8:12 AM  Result Value Ref Range   Preg Test, Ur NEGATIVE NEGATIVE    Comment:        THE SENSITIVITY OF THIS METHODOLOGY IS >24 mIU/mL     No results found.  Review of Systems  Unable to perform ROS: Intubated   Blood pressure 124/84, pulse 88, temperature 98.3 F (36.8 C), resp. rate 16, weight 59.9 kg, SpO2 100 %. Physical Exam  Constitutional: She appears well-developed.  In OR at [facility].   HENT:  ETT in place  Cardiovascular: Normal rate.  By monitor  GI: Soft.  Neurological:  GCS 3T  Skin: Skin is warm.    Assessment/Plan:  1 - Cystotomy - cystotomy closure performed as per separate operative note. JP to remian in place overnight and OK for removal if <129mL or so tomorrow. Foley to remain in place. We will  arrange post-op cystogram and MD visit in around 10-14 days.   Post-operativley this plan was discussed with pt who agreed and was thankful of intervention and plan.   Alexis Frock 12/29/2018, 6:38 PM

## 2018-12-29 NOTE — Brief Op Note (Signed)
12/29/2018  6:31 PM  PATIENT:  Michele Harmon  48 y.o. female  PRE-OPERATIVE DIAGNOSIS:  Small cystotomy  POST-OPERATIVE DIAGNOSIS:  same  PROCEDURE: robotic / laparoscopic cystotomy closure.   SURGEON:  Surgeon(s) and Role:     * Alexis Frock, MD - Primary  PHYSICIAN ASSISTANT:   ASSISTANTS: none   ANESTHESIA:   general  EBL:  50 mL   BLOOD ADMINISTERED:none  DRAINS: 1 - JP to bulb; 2 - Foley to gravity   LOCAL MEDICATIONS USED:  NONE  SPECIMEN:  No Specimen  DISPOSITION OF SPECIMEN:  N/A  COUNTS:  YES  TOURNIQUET:  * No tourniquets in log *  DICTATION: .Other Dictation: Dictation Number  [id number]  PLAN OF CARE: Discharge to home after PACU  PATIENT DISPOSITION:  PACU - hemodynamically stable.   Delay start of Pharmacological VTE agent (>24hrs) due to surgical blood loss or risk of bleeding:

## 2018-12-30 ENCOUNTER — Other Ambulatory Visit: Payer: Self-pay

## 2018-12-30 ENCOUNTER — Encounter: Payer: Self-pay | Admitting: Obstetrics & Gynecology

## 2018-12-30 ENCOUNTER — Ambulatory Visit (INDEPENDENT_AMBULATORY_CARE_PROVIDER_SITE_OTHER): Payer: BC Managed Care – PPO | Admitting: Obstetrics & Gynecology

## 2018-12-30 VITALS — BP 137/79 | HR 77

## 2018-12-30 DIAGNOSIS — Z9889 Other specified postprocedural states: Secondary | ICD-10-CM

## 2018-12-30 NOTE — Op Note (Signed)
NAMENORINNE, Michele Harmon MEDICAL RECORD [id number] ACCOUNT [id number] DATE OF BIRTH:1970/11/03 FACILITY: [facility] LOCATION: [facility] PHYSICIAN:Addie Cederberg Tresa Moore, MD  OPERATIVE REPORT  DATE OF PROCEDURE:  12/29/2018  PREOPERATIVE DIAGNOSIS:  Small cystotomy.  PROCEDURE:  Robotic-assisted laparoscopic cystotomy closure.  ESTIMATED BLOOD LOSS:  Nil.  MEDICATIONS:  None.  SPECIMENS:  None.  FINDINGS: 1.  Small cystotomy approximately 7 mm superior and medial to the right ureteral orifice. 2.  Excellent repair of cystotomy status post closure.  DRAINS: 1.  Jackson-Pratt drain to bulb suction. 2.  Foley catheter to straight drain.   INDICATIONS:  The patient is a pleasant 48 year old woman who was undergoing hysterectomy today for benign indication who was astutely noted after extirpative portions of the procedure to have a small cystotomy.  Given location close to the ureteral  orifices, intraoperative urology consultation and assistance was sought.  We gladly agreed to be part of the procedure today.  Dr. Ihor Dow provided excellent pertinent history and description for the details of her surgery thus far, and 2-surgeon  emergent consent was sought for cystotomy repair.  Intravenous antibiotics were verified.   PROCEDURE IN DETAIL:  The patient being identified and the procedure being cystotomy closure was confirmed.  Procedure was carried out.  The patient was already under general anesthesia in Trendelenburg position with Low lithotomy and assistant surgeon  at bedside.  Laparoscopic examination of the abdomen did reveal a small cystotomy.  It was carefully inspected circumferentially.  It was noted to be approximately 2.5 cm or so in total length with the right inferior corner being approximately 7 mm away  from the right ureteral orifice.  Ureteral orifices were directly visualized via the cystotomy and visually patent of clear urine.  Both were easily cannulated with a feeding tube  to approximately 6 cm, and it was not felt that there was any ureteral  injury whatsoever.  It was clear that a formal closure would be warranted.  As such, a 3-0 V-Loc was used to reapproximate the bladder mucosa and serosa in a running fashion, taking exquisite care to avoid any direct injury to the ureteral orifices.   This was performed after the in situ Foley catheter was removed.  A second layer closure of 2-0 V-Loc slightly imbricating and with a peritoneal flap coverage was then performed over the area of cystotomy.  A new Foley catheter was then placed,  16-French, 10 mL sterile water in the balloon.  This irrigated easily and quantitatively, and held 300 mL easily without obvious large volume leak.  Pelvis once again inspected.  All sponge and needle counts were correct.  The surgery was once again  returned to the care of the OB/GYN service.  LN/NUANCE  D:12/29/2018 T:12/30/2018 JOB:[id number]

## 2018-12-30 NOTE — Progress Notes (Signed)
History:  48 y.o. [id number] here today for 1 days post op check Pt is s/p RATH with bilateral salpingectomy complicated with incidental cystotomy with repair by Dr. Tresa Moore- urology. Pt went home last night. She reports some spasms from the bladder but, otherwise no problems.  She was not able to get the pyridium last night. She reports +flatus. No BM as yet. She denies N/V. She is eating without difficulty.    The following portions of the patient's history were reviewed and updated as appropriate: allergies, current medications, past family history, past medical history, past social history, past surgical history and problem list.  Review of Systems:  Pertinent items are noted in HPI.    Objective:  Physical Exam Blood pressure 137/79, pulse 77.  CONSTITUTIONAL: Well-developed, well-nourished female in no acute distress.  HENT:  Normocephalic, atraumatic EYES: Conjunctivae and EOM are normal. No scleral icterus.  NECK: Normal range of motion SKIN: Skin is warm and dry. No rash noted. Not diaphoretic.No pallor. [place]: Alert and oriented to person, place, and time. Normal coordination.  Abd: Soft, nontender and non distended; port sites clean and dry. JP drain in LLQ.  min serosanguinous drainage.  Pelvic: deferred   Assessment & Plan:  Pt is POD #1 s/p RATH with bilateral salpingectomy complicated with incidental cystotomy with repair   Doing well   JP drain removed today without complication  Pt to f/u with Dr. Tresa Moore in 2 weeks for cystogram and removal of foley  F/uwith me in 2 1/2 weeks or sooner prn  Reviewed cystotomy and how it will affect post op state. All questions answered.      Gaylyn Berish L. Harraway-Smith, M.D., Cherlynn June

## 2018-12-30 NOTE — Progress Notes (Signed)
Post op day 1. Kathrene Alu RN

## 2018-12-31 ENCOUNTER — Encounter (HOSPITAL_BASED_OUTPATIENT_CLINIC_OR_DEPARTMENT_OTHER): Payer: Self-pay | Admitting: Obstetrics & Gynecology

## 2019-01-01 ENCOUNTER — Encounter: Payer: BC Managed Care – PPO | Admitting: Obstetrics & Gynecology

## 2019-01-12 ENCOUNTER — Telehealth: Payer: Self-pay

## 2019-01-12 NOTE — Telephone Encounter (Signed)
Patient called and is two weeks post op. she got the catheter out yesterday and is having slight burning with urination but is able to void without problems. Patient denies any abdominal pain or fever at this time.  Patient is schedule for post op on Monday Aug. 24 th and reminded of this appointment.  Kathrene Alu RN

## 2019-01-18 ENCOUNTER — Ambulatory Visit (INDEPENDENT_AMBULATORY_CARE_PROVIDER_SITE_OTHER): Payer: BC Managed Care – PPO | Admitting: Obstetrics & Gynecology

## 2019-01-18 ENCOUNTER — Encounter: Payer: Self-pay | Admitting: Obstetrics & Gynecology

## 2019-01-18 ENCOUNTER — Other Ambulatory Visit: Payer: Self-pay

## 2019-01-18 VITALS — BP 128/89 | HR 86 | Ht 62.0 in | Wt 129.0 lb

## 2019-01-18 DIAGNOSIS — Z09 Encounter for follow-up examination after completed treatment for conditions other than malignant neoplasm: Secondary | ICD-10-CM

## 2019-01-18 NOTE — Progress Notes (Signed)
History:  48 y.o. [id number] here today for 3 weeks post op check from a [place] with bilateral salpingectomy complicated by an incidental cystectomy. Pt is doing well. She denies any issues. She reports that she is voiding well but, occ has stinging but, no dysuria or hematuria. She reports adequate pain control and is off all narcs.   The following portions of the patient's history were reviewed and updated as appropriate: allergies, current medications, past family history, past medical history, past social history, past surgical history and problem list.  Review of Systems:  Pertinent items are noted in HPI.    Objective:  Physical Exam Blood pressure 128/89, pulse 86, height 5\' 2"  (1.575 m), weight 129 lb 0.6 oz (58.5 kg).  CONSTITUTIONAL: Well-developed, well-nourished female in no acute distress.  HENT:  Normocephalic, atraumatic EYES: Conjunctivae and EOM are normal. No scleral icterus.  NECK: Normal range of motion SKIN: Skin is warm and dry. No rash noted. Not diaphoretic.No pallor. [place]: Alert and oriented to person, place, and time. Normal coordination.  Abd: Soft, nontender and nondistended; port sites healing well.  Pelvic: deferred  Labs and Imaging 12/29/2018 Diagnosis Uterus, cervix and bilateral fallopian tubes CERVIX: - SQUAMOUS METAPLASIA. - NO DYSPLASIA OR MALIGNANCY. ENDOMETRIUM: - INACTIVE. - NO HYPERPLASIA OR MALIGNANCY. 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 - NO ENDOMETRIOSIS OR MALIGNANCY.  Assessment & Plan:  3 weeks post op check- doing well.   Gradual increase in activities.   Nothing per vagina for a full 8 weeks  F/u in 3-4 weeks or sooner prn  Reviewed surg path  All questions answered.   Elester Apodaca L. Harraway-Smith, M.D., Cherlynn June

## 2019-01-18 NOTE — Patient Instructions (Signed)

## 2019-01-19 ENCOUNTER — Encounter: Payer: Self-pay | Admitting: Obstetrics & Gynecology

## 2019-01-25 ENCOUNTER — Telehealth: Payer: Self-pay

## 2019-01-25 NOTE — Telephone Encounter (Signed)
Patient called stating she needed to let Dr. Ihor Dow know she would like to take the full six weeks of leave.  I asked patient if additional paperwork was needed to be filled out and she said no just wanted to let DR. Ihor Dow know. Kathrene Alu RN

## 2019-02-17 ENCOUNTER — Ambulatory Visit (INDEPENDENT_AMBULATORY_CARE_PROVIDER_SITE_OTHER): Payer: BC Managed Care – PPO | Admitting: Obstetrics & Gynecology

## 2019-02-17 ENCOUNTER — Encounter: Payer: Self-pay | Admitting: Obstetrics & Gynecology

## 2019-02-17 ENCOUNTER — Other Ambulatory Visit: Payer: Self-pay

## 2019-02-17 VITALS — BP 129/88 | HR 78 | Ht 62.0 in | Wt 132.1 lb

## 2019-02-17 DIAGNOSIS — Z9889 Other specified postprocedural states: Secondary | ICD-10-CM

## 2019-02-17 DIAGNOSIS — N898 Other specified noninflammatory disorders of vagina: Secondary | ICD-10-CM

## 2019-02-17 NOTE — Progress Notes (Signed)
History:  48 y.o. Michele Harmon here today for 6 weeks post op check. Pt is s/p RATH with bilateral salpingectomy complicated with incidental cystotomy with repair. She is doing well. She denies pain. She reports that she feel much better than prior to surgery. NO problems with voiding or passing stools.   The following portions of the patient's history were reviewed and updated as appropriate: allergies, current medications, past family history, past medical history, past social history, past surgical history and problem list.  Review of Systems:  Pertinent items are noted in HPI.    Objective:  Physical Exam Blood pressure 129/88, pulse 78, height 5\' 2"  (1.575 m), weight 132 lb 1.3 oz (59.9 kg).  CONSTITUTIONAL: Well-developed, well-nourished female in no acute distress.  HENT:  Normocephalic, atraumatic EYES: Conjunctivae and EOM are normal. No scleral icterus.  NECK: Normal range of motion SKIN: Skin is warm and dry. No rash noted. Not diaphoretic.No pallor. [place]: Alert and oriented to person, place, and time. Normal coordination.  Abd: Soft, nontender and nondistended; port sited well healed s Pelvic: Normal appearing external genitalia; normal appearing vaginal mucosa and cervix.  Normal discharge.  Small uterus, no other palpable masses, no uterine or adnexal tenderness  Labs and Imaging 12/29/2018 Diagnosis Uterus, cervix and bilateral fallopian tubes CERVIX: - SQUAMOUS METAPLASIA. - NO DYSPLASIA OR MALIGNANCY. ENDOMETRIUM: - INACTIVE. - NO HYPERPLASIA OR MALIGNANCY. 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 - NO ENDOMETRIOSIS OR MALIGNANCY.  Assessment & Plan:  6 weeks post op check. Pt is doing well  Return to full activities. May return to sexual activity at 8 week post op  Granulation tissue at cuff.   Treated with silver Nitrate.   F/u in 2 weeks to reassess.   Reviewed post op f/u and  activities. All questions answered.   Neal Oshea L. Harraway-Smith, M.D., [place]

## 2019-03-10 ENCOUNTER — Ambulatory Visit (INDEPENDENT_AMBULATORY_CARE_PROVIDER_SITE_OTHER): Payer: BC Managed Care – PPO | Admitting: Obstetrics & Gynecology

## 2019-03-10 ENCOUNTER — Other Ambulatory Visit (HOSPITAL_BASED_OUTPATIENT_CLINIC_OR_DEPARTMENT_OTHER): Payer: Self-pay | Admitting: Obstetrics & Gynecology

## 2019-03-10 ENCOUNTER — Encounter: Payer: Self-pay | Admitting: Obstetrics & Gynecology

## 2019-03-10 ENCOUNTER — Other Ambulatory Visit: Payer: Self-pay

## 2019-03-10 VITALS — BP 124/82 | HR 83 | Ht 62.0 in | Wt 129.1 lb

## 2019-03-10 DIAGNOSIS — N898 Other specified noninflammatory disorders of vagina: Secondary | ICD-10-CM | POA: Diagnosis not present

## 2019-03-10 DIAGNOSIS — Z1231 Encounter for screening mammogram for malignant neoplasm of breast: Secondary | ICD-10-CM

## 2019-03-10 NOTE — Progress Notes (Signed)
History:  48 y.o. [id number] here today for f/u of granulation tissue at the cuff. She denies bleeding since her last visit. She denies pain.     The following portions of the patient's history were reviewed and updated as appropriate: allergies, current medications, past family history, past medical history, past social history, past surgical history and problem list.  Review of Systems:  Pertinent items are noted in HPI.    Objective:  Physical Exam Blood pressure 124/82, pulse 83, height 5\' 2"  (1.575 m), weight 129 lb 1.9 oz (58.6 kg).  CONSTITUTIONAL: Well-developed, well-nourished female in no acute distress.  HENT:  Normocephalic, atraumatic EYES: Conjunctivae and EOM are normal. No scleral icterus.  NECK: Normal range of motion SKIN: Skin is warm and dry. No rash noted. Not diaphoretic.No pallor. [place]: Alert and oriented to person, place, and time. Normal coordination.  Abd: Soft, nontender and nondistended Pelvic: Normal appearing external genitalia; There is still a small area of granulation tissue. It is not bleeding. This was treated with silver nitrate.       Assessment & Plan:  Granulation tissue at vaginal cuff. Improved.   May resume sexual activity  F/u 3 months post op or sooner prn  Total face-to-face time with patient was 10 min.  Greater than 50% was spent in counseling and coordination of care with the patient.   Arthea Nobel L. Harraway-Smith, M.D., [place] June

## 2019-03-11 ENCOUNTER — Encounter: Payer: Self-pay | Admitting: Obstetrics & Gynecology

## 2019-03-26 ENCOUNTER — Other Ambulatory Visit: Payer: Self-pay

## 2019-03-26 ENCOUNTER — Ambulatory Visit (HOSPITAL_BASED_OUTPATIENT_CLINIC_OR_DEPARTMENT_OTHER)
Admission: RE | Admit: 2019-03-26 | Discharge: 2019-03-26 | Disposition: A | Discharge status: Home / Self Care | Payer: BC Managed Care – PPO | Source: Ambulatory Visit | Attending: Obstetrics & Gynecology | Admitting: Obstetrics & Gynecology

## 2019-03-26 DIAGNOSIS — Z1231 Encounter for screening mammogram for malignant neoplasm of breast: Secondary | ICD-10-CM

## 2020-12-12 IMAGING — MG DIGITAL SCREENING BILAT W/ TOMO
8 series · 9 of 24 positions shown · non-contrast
Comparison: Previous exam(s).

CLINICAL DATA: Screening.

EXAM:
DIGITAL SCREENING BILATERAL MAMMOGRAM WITH TOMO AND CAD

[L CC synth-2D]
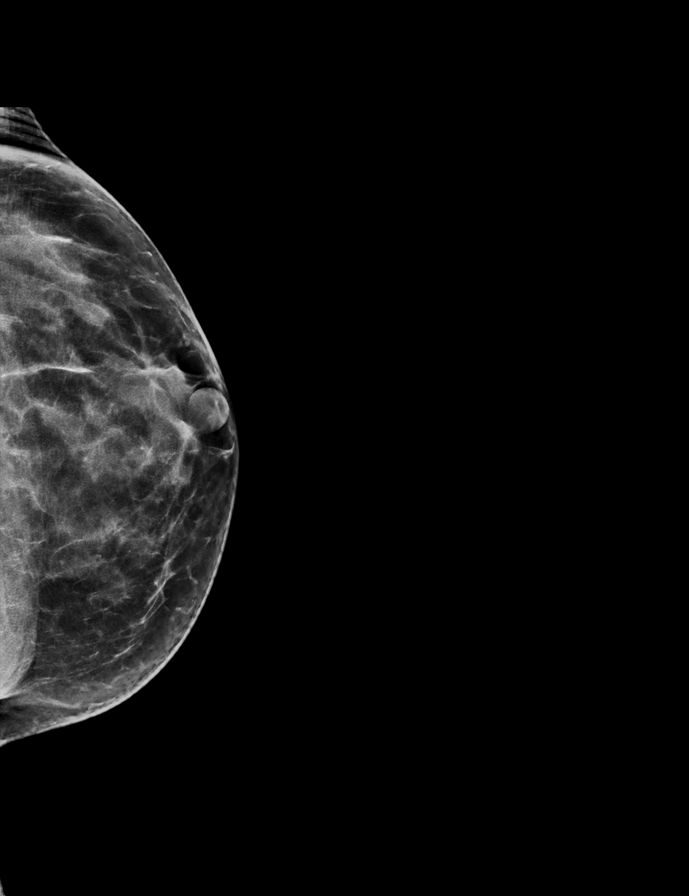

[R MLO synth-2D]
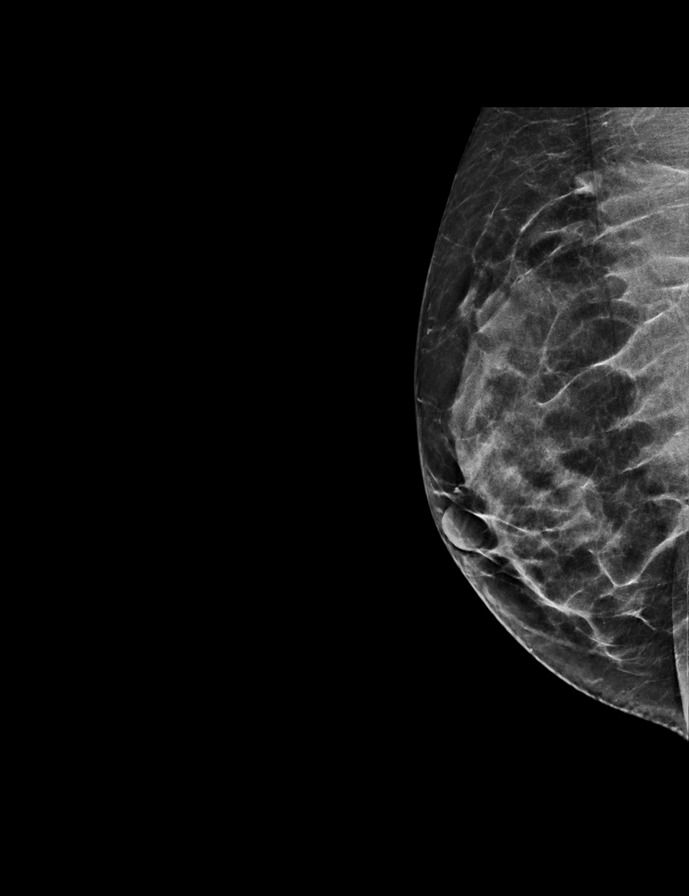

[L MLO synth-2D]
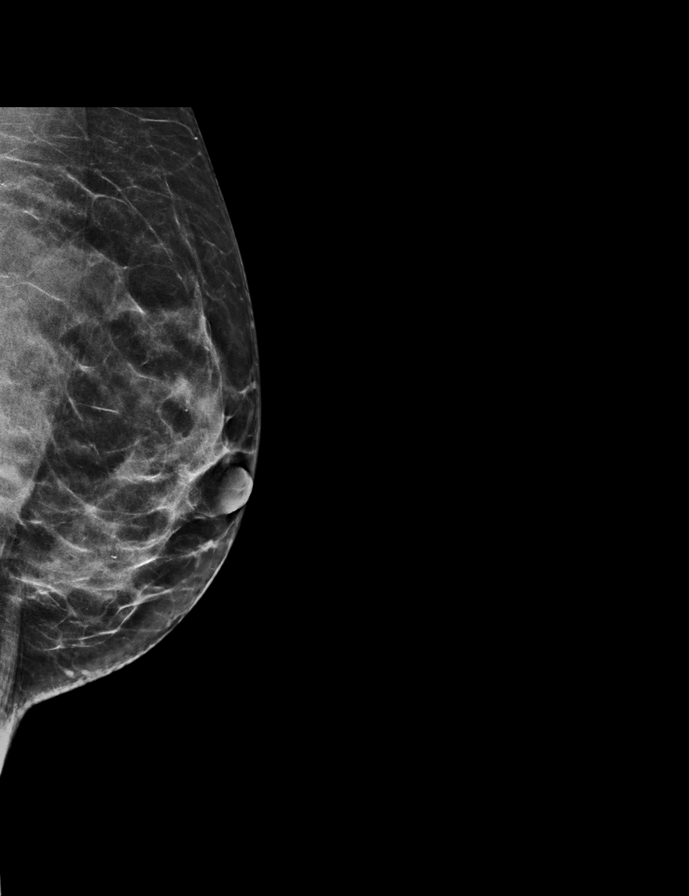

[R CC synth-2D]
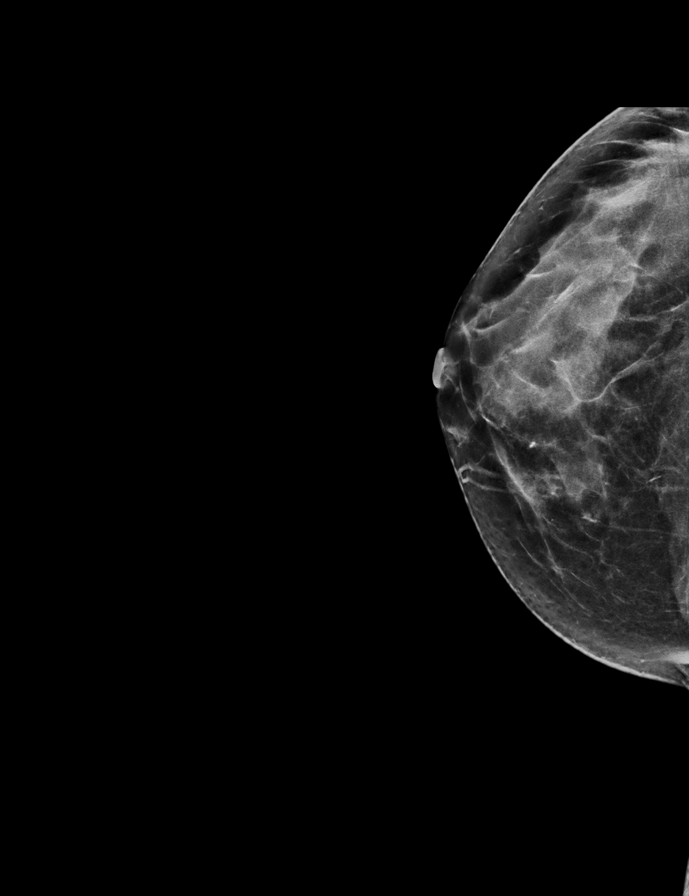

[L MLO tomo · 2 of 57 frames shown]
[frame 19/57]
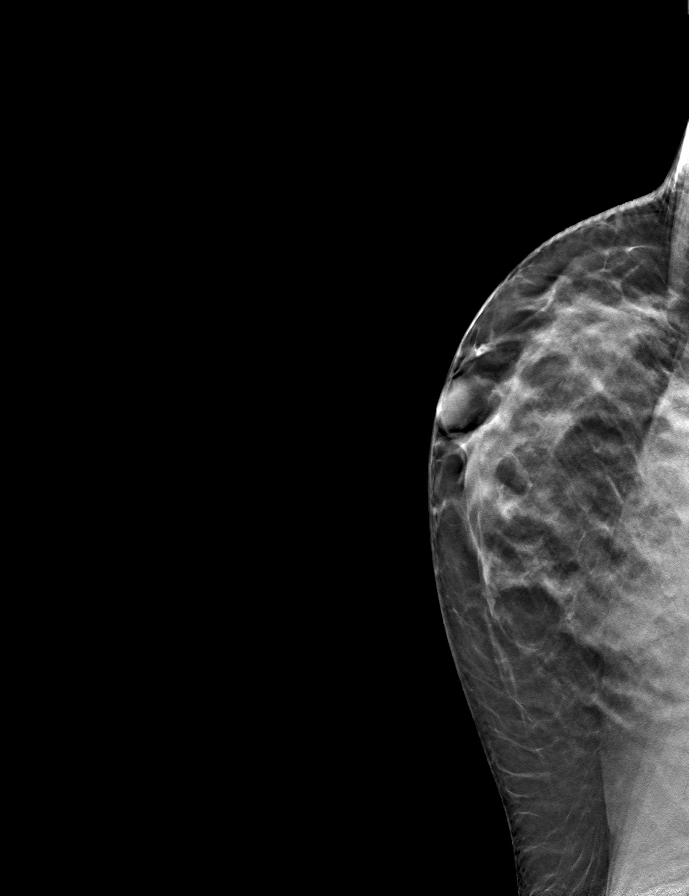
[frame 29/57]
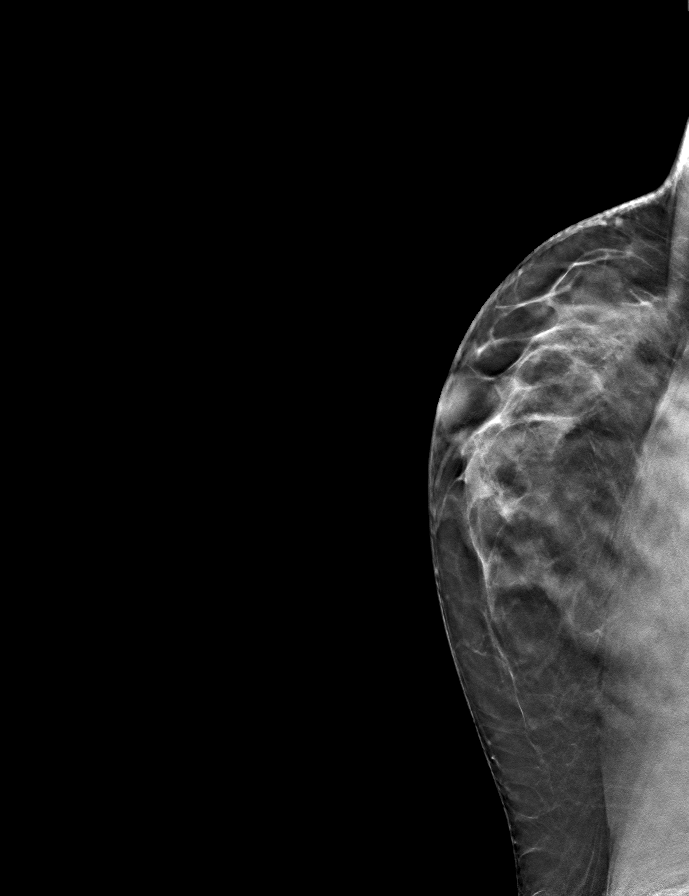

[R CC tomo · tomo slice 30/59.0]
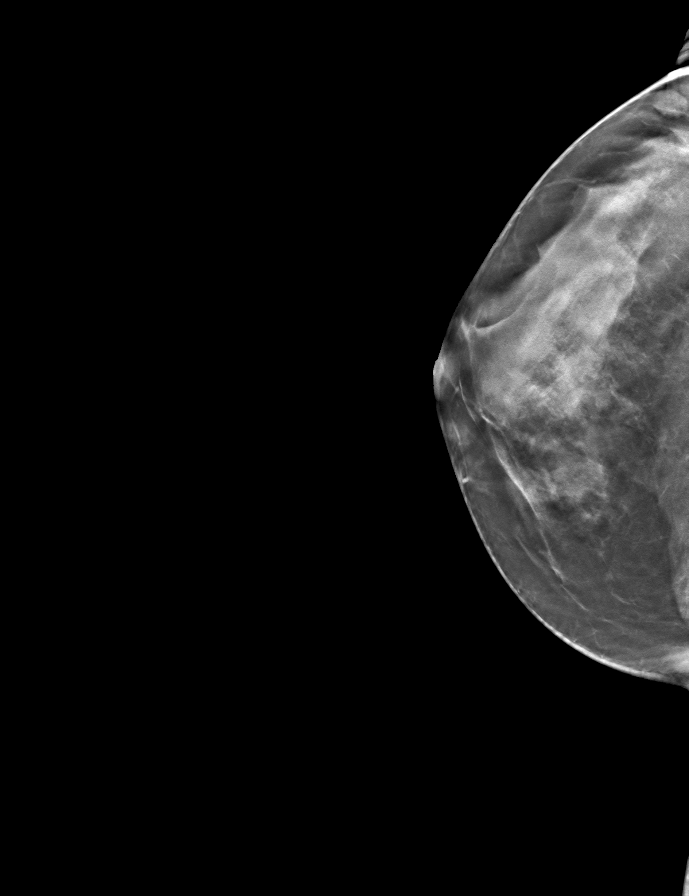

[L CC tomo · tomo slice 31/62.0]
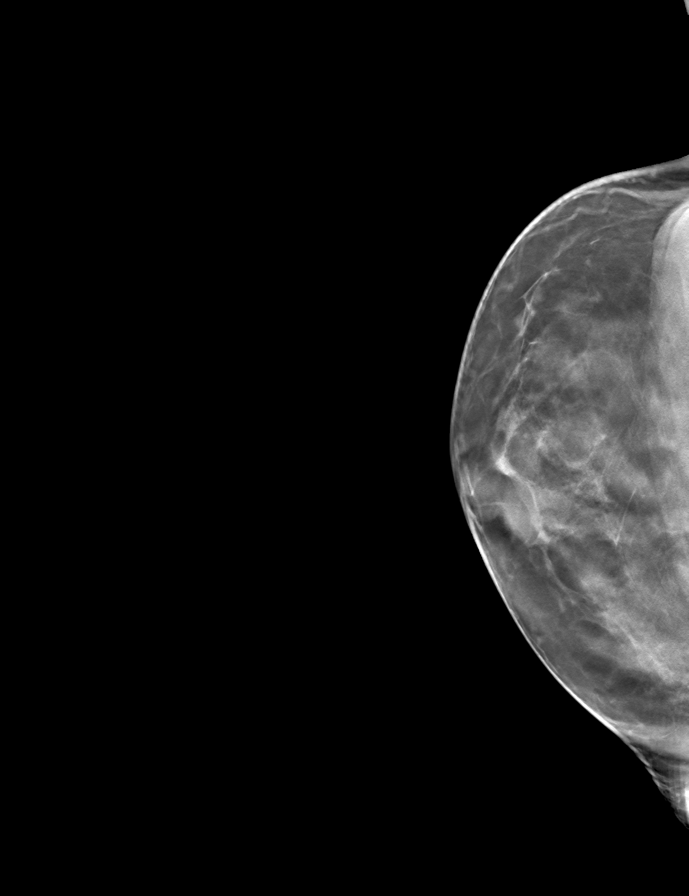

[R MLO tomo · tomo slice 28/55.0]
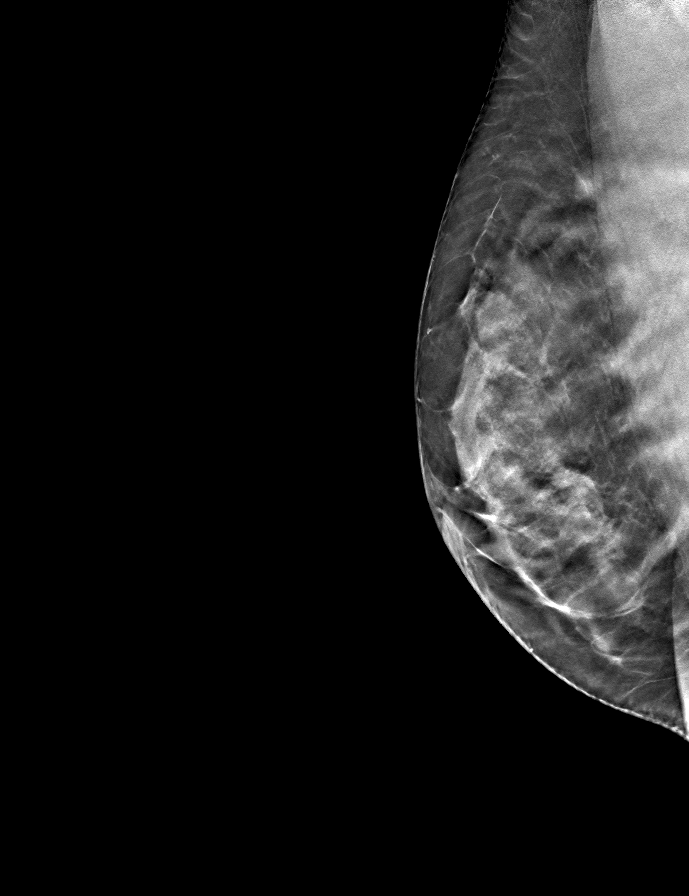

[9 of 24 positions shown; findings below may reference images not displayed]

ACR Breast Density Category c: The breast tissue is heterogeneously
dense, which may obscure small masses.
FINDINGS: There are no findings suspicious for malignancy. Images were
processed with CAD.
IMPRESSION: No mammographic evidence of malignancy. A result letter of this
screening mammogram will be mailed directly to the patient.

RECOMMENDATION:
Screening mammogram in one year. (Code:FT-U-LHB)

BI-RADS CATEGORY  1: Negative.
